# Patient Record
Sex: Female | Born: 1960 | Race: White | Hispanic: No | Marital: Married | Smoking: Never smoker
Health system: Southern US, Community
[De-identification: ages and names within clinical notes are randomized; demographics above are authoritative.]

## PROBLEM LIST (undated history)

## (undated) DIAGNOSIS — M069 Rheumatoid arthritis, unspecified: Secondary | ICD-10-CM

## (undated) DIAGNOSIS — M1990 Unspecified osteoarthritis, unspecified site: Secondary | ICD-10-CM

## (undated) DIAGNOSIS — M2550 Pain in unspecified joint: Secondary | ICD-10-CM

## (undated) DIAGNOSIS — F419 Anxiety disorder, unspecified: Secondary | ICD-10-CM

## (undated) DIAGNOSIS — F329 Major depressive disorder, single episode, unspecified: Secondary | ICD-10-CM

## (undated) DIAGNOSIS — M79643 Pain in unspecified hand: Secondary | ICD-10-CM

## (undated) DIAGNOSIS — T7840XA Allergy, unspecified, initial encounter: Secondary | ICD-10-CM

## (undated) DIAGNOSIS — I10 Essential (primary) hypertension: Secondary | ICD-10-CM

## (undated) DIAGNOSIS — F32A Depression, unspecified: Secondary | ICD-10-CM

## (undated) HISTORY — DX: Unspecified osteoarthritis, unspecified site: M19.90

## (undated) HISTORY — DX: Allergy, unspecified, initial encounter: T78.40XA

## (undated) HISTORY — DX: Essential (primary) hypertension: I10

## (undated) HISTORY — DX: Anxiety disorder, unspecified: F41.9

## (undated) HISTORY — DX: Pain in unspecified joint: M25.50

## (undated) HISTORY — DX: Pain in unspecified hand: M79.643

## (undated) HISTORY — DX: Depression, unspecified: F32.A

## (undated) HISTORY — DX: Major depressive disorder, single episode, unspecified: F32.9

## (undated) HISTORY — DX: Rheumatoid arthritis, unspecified: M06.9

---

## 2013-03-01 ENCOUNTER — Other Ambulatory Visit (HOSPITAL_COMMUNITY)
Admission: RE | Admit: 2013-03-01 | Discharge: 2013-03-01 | Disposition: A | Discharge status: Home / Self Care | Payer: 59 | Source: Ambulatory Visit | Attending: Family Medicine | Admitting: Family Medicine

## 2013-03-01 DIAGNOSIS — Z124 Encounter for screening for malignant neoplasm of cervix: Secondary | ICD-10-CM | POA: Insufficient documentation

## 2014-08-01 ENCOUNTER — Ambulatory Visit (INDEPENDENT_AMBULATORY_CARE_PROVIDER_SITE_OTHER): Payer: 59 | Admitting: Podiatry

## 2014-08-01 ENCOUNTER — Encounter: Payer: Self-pay | Admitting: Podiatry

## 2014-08-01 VITALS — BP 132/76 | HR 69 | Resp 18

## 2014-08-01 DIAGNOSIS — L03039 Cellulitis of unspecified toe: Secondary | ICD-10-CM

## 2014-08-01 MED ORDER — CEPHALEXIN 500 MG PO CAPS: 500.0000 mg | ORAL_CAPSULE | Freq: Three times a day (TID) | ORAL | Status: DC

## 2014-08-01 NOTE — Progress Notes (Signed)
° °  Subjective:    Patient ID: Dana Johnston, female    DOB: 1961/11/18, 52 y.o.   MRN: [id number]  HPI 53 year old female presents the office today for complaints of right lateral border of the hallux ingrown toenail. She states it has been like this for over the past week. This is a small amount of purulence and redness from around the nail border as well as pain.. States that the nail previously fell off after an injury last year. States that as the nail grew back she noticed it was ingrowing. Denies any systemic complaints as fevers, chills, nausea, vomiting.  Review of Systems  Musculoskeletal:       JOINT PAIN  Allergic/Immunologic: Positive for food allergies.  All other systems reviewed and are negative.      Objective:   Physical Exam AAO x3, NAD DP/PT pulses palpable 2/4 b/l. CRT <3sec Protective sensation intact the Semmes Weinstein monofilament, vibratory sensation intact, Achilles tendon reflex intact.  Right lateral border of the hallux with localized erythema around the nail border. No ascending cellulitis. Mild amount of purulent drainage from around the proximal lateral nail border. Lateral nail border is ingrown. Remaining nails without any evidence of ingrowing, erythema, drainage. No open lesions MMT 5/5, ROM WNL No leg pain, warmth, swelling.     Assessment & Plan:  53 year old female right lateral hallux border paronychia. -Surgical versus conservative treatment were discussed with the patient in detail including alternatives, risks, complications. -At this time the patient elects to proceed with partial nail avulsion. Risks and complications discussed the patient agrees to procedure. -Under sterile skin preparation a total of 2.5 cc of a one-to-one mixture of 0.5% Marcaine plain and 2% lidocaine plain was infiltrated in a hallux block fashion on the right foot. The skin was then prepped in a sterile fashion. Next the lateral border of the right hallux nail sharply  excised. Of note there is a large portion of ingrowing nail on the lateral border. Care was taken to ensure the entire nail border was adequately removed. Once removed the area was flushed, Silvadene was applied followed by a sterile dressing. A tourniquet was used for the procedure and after the dressing was applied it was removed and there is noted to be an immediate capillary refill time noted to the digit. Patient tolerated the procedure well without complications. -Post procedure instructions given to the patient and she verbally understood. -Prescribed Keflex. -Monitor for any signs or symptoms of infection and directed to call the office immediately if any are to occur or go directly to the emergency room. -Call with any questions concerns or change in symptoms.

## 2014-08-01 NOTE — Patient Instructions (Addendum)
Betadine Soak Instructions  Purchase an 8 oz. bottle of BETADINE solution (Povidone)  THE DAY AFTER THE PROCEDURE  Place 1 tablespoon of betadine solution in a quart of warm tap water.  Submerge your foot or feet with outer bandage intact for the initial soak; this will allow the bandage to become moist and wet for easy lift off.  Once you remove your bandage, continue to soak in the solution for 20 minutes.  This soak should be done twice a day.  Next, remove your foot or feet from solution, blot dry the affected area and cover.  You may use a band aid large enough to cover the area or use gauze and tape.  Apply other medications to the area as directed by the doctor such as cortisporin otic solution (ear drops) or neosporin.  IF YOUR SKIN BECOMES IRRITATED WHILE USING THESE INSTRUCTIONS, IT IS OKAY TO SWITCH TO EPSOM SALTS AND WATER OR WHITE VINEGAR AND WATER.  Monitor for any signs/symptoms of infection. Call the office immediately if any occur or go directly to the emergency room. Call with any questions/concerns.  

## 2014-08-04 ENCOUNTER — Telehealth: Payer: Self-pay | Admitting: Podiatry

## 2014-08-04 NOTE — Telephone Encounter (Signed)
Called patient to follow up with her after the nail procedure on Friday. States she is doing well. Denies any fevers, chills, nausea, or vomiting. States she has started to leave it uncovered while working. I recommended that she keep the toe covered with antibiotic ointment/band-aid during the day. Also recommended continuing with BID epsom salt soaks. No other complaints at this time. Follow-up as scheduled. Call with any question/concerns in the meantime.

## 2014-08-06 ENCOUNTER — Ambulatory Visit: Payer: 59 | Admitting: Podiatry

## 2014-08-15 ENCOUNTER — Ambulatory Visit: Payer: 59 | Admitting: Podiatry

## 2015-03-19 ENCOUNTER — Other Ambulatory Visit (HOSPITAL_COMMUNITY)
Admission: RE | Admit: 2015-03-19 | Discharge: 2015-03-19 | Disposition: A | Discharge status: Home / Self Care | Payer: 59 | Source: Ambulatory Visit | Attending: Internal Medicine | Admitting: Internal Medicine

## 2015-03-19 DIAGNOSIS — Z01419 Encounter for gynecological examination (general) (routine) without abnormal findings: Secondary | ICD-10-CM | POA: Insufficient documentation

## 2015-03-20 ENCOUNTER — Other Ambulatory Visit: Payer: Self-pay

## 2015-03-20 DIAGNOSIS — Z1231 Encounter for screening mammogram for malignant neoplasm of breast: Secondary | ICD-10-CM

## 2015-04-15 ENCOUNTER — Ambulatory Visit: Payer: Self-pay

## 2015-05-19 ENCOUNTER — Encounter: Payer: Self-pay | Admitting: Internal Medicine

## 2016-01-15 ENCOUNTER — Other Ambulatory Visit: Payer: Self-pay | Admitting: Internal Medicine

## 2016-01-15 DIAGNOSIS — M25562 Pain in left knee: Secondary | ICD-10-CM

## 2016-02-03 ENCOUNTER — Ambulatory Visit
Admission: RE | Admit: 2016-02-03 | Discharge: 2016-02-03 | Disposition: A | Discharge status: Home / Self Care | Payer: BLUE CROSS/BLUE SHIELD | Source: Ambulatory Visit | Attending: Internal Medicine | Admitting: Internal Medicine

## 2016-02-03 DIAGNOSIS — M25562 Pain in left knee: Secondary | ICD-10-CM

## 2016-03-21 HISTORY — PX: MEDIAL PARTIAL KNEE REPLACEMENT: SHX5965

## 2016-10-11 ENCOUNTER — Telehealth: Payer: Self-pay | Admitting: *Deleted

## 2016-10-11 NOTE — Telephone Encounter (Signed)
I would wait until she has a more stable platelet count

## 2016-10-11 NOTE — Telephone Encounter (Deleted)
Pt wants to know if it's ok for her to get the flu vaccine. Thanks

## 2016-10-11 NOTE — Telephone Encounter (Deleted)
Pt called and informed to wait getting flu vaccine until platelet count more stable per Dr Cyndie Chime. Voiced understanding.

## 2016-12-09 ENCOUNTER — Other Ambulatory Visit: Payer: Self-pay | Admitting: Oncology

## 2016-12-09 ENCOUNTER — Telehealth: Payer: Self-pay | Admitting: *Deleted

## 2016-12-09 DIAGNOSIS — D693 Immune thrombocytopenic purpura: Secondary | ICD-10-CM

## 2016-12-09 DIAGNOSIS — I10 Essential (primary) hypertension: Secondary | ICD-10-CM

## 2016-12-09 DIAGNOSIS — E876 Hypokalemia: Secondary | ICD-10-CM

## 2016-12-09 NOTE — Telephone Encounter (Deleted)
Dana Johnston called / stated she will be here in [place] on Wed; wants her labs done here at [facility]. Need orders. She will be here around 1100 AM. Thanks

## 2016-12-09 NOTE — Telephone Encounter (Signed)
OK I will put them in

## 2016-12-13 NOTE — Progress Notes (Signed)
Ordered on wrong pt.

## 2016-12-13 NOTE — Telephone Encounter (Signed)
Documented in error.

## 2016-12-13 NOTE — Telephone Encounter (Signed)
Charted in error.

## 2016-12-14 ENCOUNTER — Other Ambulatory Visit: Payer: BLUE CROSS/BLUE SHIELD

## 2017-03-29 NOTE — Addendum Note (Signed)
Addended by: Remus Blake on: 03/29/2017 02:18 PM   Modules accepted: Orders

## 2017-05-01 ENCOUNTER — Encounter (HOSPITAL_COMMUNITY): Payer: Self-pay

## 2017-05-01 ENCOUNTER — Inpatient Hospital Stay (HOSPITAL_COMMUNITY)
Admission: EM | Admit: 2017-05-01 | Discharge: 2017-05-03 | DRG: 918 | Disposition: A | Discharge status: Other Acute Inpatient Hospital | Payer: BLUE CROSS/BLUE SHIELD | Attending: Internal Medicine | Admitting: Internal Medicine

## 2017-05-01 DIAGNOSIS — T450X2A Poisoning by antiallergic and antiemetic drugs, intentional self-harm, initial encounter: Secondary | ICD-10-CM | POA: Diagnosis not present

## 2017-05-01 DIAGNOSIS — T50902A Poisoning by unspecified drugs, medicaments and biological substances, intentional self-harm, initial encounter: Secondary | ICD-10-CM | POA: Diagnosis not present

## 2017-05-01 DIAGNOSIS — F32A Depression, unspecified: Secondary | ICD-10-CM

## 2017-05-01 DIAGNOSIS — Z79899 Other long term (current) drug therapy: Secondary | ICD-10-CM

## 2017-05-01 DIAGNOSIS — T50901A Poisoning by unspecified drugs, medicaments and biological substances, accidental (unintentional), initial encounter: Secondary | ICD-10-CM | POA: Diagnosis present

## 2017-05-01 DIAGNOSIS — R443 Hallucinations, unspecified: Secondary | ICD-10-CM | POA: Diagnosis present

## 2017-05-01 DIAGNOSIS — R400 Somnolence: Secondary | ICD-10-CM | POA: Diagnosis present

## 2017-05-01 DIAGNOSIS — M1990 Unspecified osteoarthritis, unspecified site: Secondary | ICD-10-CM | POA: Diagnosis present

## 2017-05-01 DIAGNOSIS — R4182 Altered mental status, unspecified: Secondary | ICD-10-CM | POA: Diagnosis present

## 2017-05-01 DIAGNOSIS — E876 Hypokalemia: Secondary | ICD-10-CM | POA: Diagnosis present

## 2017-05-01 DIAGNOSIS — F329 Major depressive disorder, single episode, unspecified: Secondary | ICD-10-CM | POA: Diagnosis present

## 2017-05-01 HISTORY — DX: Poisoning by unspecified drugs, medicaments and biological substances, accidental (unintentional), initial encounter: T50.901A

## 2017-05-01 LAB — COMPREHENSIVE METABOLIC PANEL
ALBUMIN: 4.3 g/dL (ref 3.5–5.0)
ALT: 21 U/L (ref 14–54)
ANION GAP: 10 (ref 5–15)
AST: 23 U/L (ref 15–41)
Alkaline Phosphatase: 91 U/L (ref 38–126)
BUN: 13 mg/dL (ref 6–20)
CHLORIDE: 107 mmol/L (ref 101–111)
CO2: 23 mmol/L (ref 22–32)
Calcium: 9.1 mg/dL (ref 8.9–10.3)
Creatinine, Ser: 0.93 mg/dL (ref 0.44–1.00)
GFR calc Af Amer: >60 mL/min (ref >60)
GFR calc non Af Amer: >60 mL/min (ref >60)
GLUCOSE: 98 mg/dL (ref 65–99)
Potassium: 3.3 mmol/L — ABNORMAL LOW (ref 3.5–5.1)
SODIUM: 140 mmol/L (ref 135–145)
TOTAL PROTEIN: 7.2 g/dL (ref 6.5–8.1)
Total Bilirubin: 0.7 mg/dL (ref 0.3–1.2)

## 2017-05-01 LAB — URINALYSIS, ROUTINE W REFLEX MICROSCOPIC
BILIRUBIN URINE: NEGATIVE
Glucose, UA: NEGATIVE mg/dL
HGB URINE DIPSTICK: NEGATIVE
Ketones, ur: NEGATIVE mg/dL
Nitrite: NEGATIVE
PH: 6 (ref 5.0–8.0)
Protein, ur: NEGATIVE mg/dL
SPECIFIC GRAVITY, URINE: 1.016 (ref 1.005–1.030)

## 2017-05-01 LAB — CBC WITH DIFFERENTIAL/PLATELET
BASOS ABS: 0 10*3/uL (ref 0.0–0.1)
Basophils Relative: 0 %
Eosinophils Absolute: 0.3 10*3/uL (ref 0.0–0.7)
Eosinophils Relative: 3 %
HEMATOCRIT: 44.2 % (ref 36.0–46.0)
Hemoglobin: 14.6 g/dL (ref 12.0–15.0)
LYMPHS ABS: 1.7 10*3/uL (ref 0.7–4.0)
LYMPHS PCT: 21 %
MCH: 29.4 pg (ref 26.0–34.0)
MCHC: 33 g/dL (ref 30.0–36.0)
MCV: 88.9 fL (ref 78.0–100.0)
MONO ABS: 0.7 10*3/uL (ref 0.1–1.0)
MONOS PCT: 8 %
NEUTROS ABS: 5.5 10*3/uL (ref 1.7–7.7)
Neutrophils Relative %: 68 %
Platelets: 283 10*3/uL (ref 150–400)
RBC: 4.97 MIL/uL (ref 3.87–5.11)
RDW: 12.7 % (ref 11.5–15.5)
WBC: 8.1 10*3/uL (ref 4.0–10.5)

## 2017-05-01 LAB — RAPID URINE DRUG SCREEN, HOSP PERFORMED
Amphetamines: NOT DETECTED
BARBITURATES: NOT DETECTED
Benzodiazepines: NOT DETECTED
Cocaine: NOT DETECTED
Opiates: NOT DETECTED
TETRAHYDROCANNABINOL: NOT DETECTED

## 2017-05-01 LAB — ETHANOL

## 2017-05-01 MED ORDER — SODIUM CHLORIDE 0.9 % IV BOLUS (SEPSIS)
500.0000 mL | Freq: Once | INTRAVENOUS | Status: AC
  Administered 2017-05-01: 500 mL via INTRAVENOUS

## 2017-05-01 MED ORDER — LORAZEPAM 2 MG/ML IJ SOLN
1.0000 mg | Freq: Once | INTRAMUSCULAR | Status: AC
  Administered 2017-05-01: 1 mg via INTRAVENOUS
  Filled 2017-05-01: qty 1

## 2017-05-01 MED ORDER — CHARCOAL ACTIVATED PO LIQD
50.0000 g | Freq: Once | ORAL | Status: AC
  Administered 2017-05-01: 50 g via ORAL
  Filled 2017-05-01: qty 240

## 2017-05-01 NOTE — ED Notes (Addendum)
Poison control called by this nurse. Poison control recommendations are as follows:  1) While patient is awake and alert, administer PO Charcoal. 2) Should patient experience tachycardia and hypertension, these could be signs of early toxicity. Possible side effects of toxicity include seizures, confusion, lethargy, and agitation. Administer benzodiazepine of MD's choice to mitigate these side effects. 3) Should patient have a seizure despite benzodiazepine administration, administer phenobarbital.  MD Effie Shy notified face to face of poison control recommendations. See new orders.

## 2017-05-01 NOTE — ED Triage Notes (Signed)
Patient BIB EMS from home, c/o SI, benedryl PO overdose. Patient reports taking approx x50 pills of 25mg  Benedryl, but states " I threw up a few after taking them." Patient alert in triage. VSS- patient hypertensive and tachycardic for EMS.

## 2017-05-01 NOTE — ED Provider Notes (Addendum)
[facility]-EMERGENCY DEPT Provider Note   CSN: [id number] Arrival date & time: 05/01/17  1739     History   Chief Complaint Chief Complaint  Patient presents with  . Drug Overdose  . Suicidal    HPI Dana Johnston is a 56 y.o. female.  She presents for evaluation of an intentional overdose of Benadryl, with intentional self-harm.  Patient reports that she was distressed because her wife's son, is disruptive and has a defiant behavioral disorder.  This is causing chaos in the home, which makes it very difficult to stay there.  The patient has recently started seeing a psychiatrist but is not on psychiatric medications, and is not seeing a therapist.  She denies prior suicide attempt.  She still feels suicidal.  She denies any recent illnesses or other ingestions.  There are no other known modifying factors.  HPI  Past Medical History:  Diagnosis Date  . Arthritis   . Hand pain   . Polyarthralgia     There are no active problems to display for this patient.   History reviewed. No pertinent surgical history.  OB History    No data available       Home Medications    Prior to Admission medications   Medication Sig Start Date End Date Taking? Authorizing Provider  meloxicam (MOBIC) 15 MG tablet Take 15 mg by mouth daily.   Yes [provider]  methotrexate (RHEUMATREX) 10 MG tablet Take 60 mg by mouth every 30 (thirty) days. Caution: Chemotherapy. Protect from light.   Yes [provider]  zolmitriptan (ZOMIG) 5 MG tablet Take 5 mg by mouth daily as needed for migraine.  07/10/14  Yes [provider]  cephALEXin (KEFLEX) 500 MG capsule Take 1 capsule (500 mg total) by mouth 3 (three) times daily. Patient not taking: Reported on 05/01/2017 08/01/14   Vivi Barrack, DPM    Family History History reviewed. No pertinent family history.  Social History Social History  Substance Use Topics  . Smoking status: Never Smoker  . Smokeless tobacco: Never  Used  . Alcohol use Yes     Comment: Occasional margarita     Allergies   Other and Sulfa antibiotics   Review of Systems Review of Systems  All other systems reviewed and are negative.    Physical Exam Updated Vital Signs BP (!) 158/79   Pulse (!) 112   Temp 98.2 F (36.8 C) (Oral)   Resp (!) 25   Ht 5' 5.5" (1.664 m)   Wt 89.4 kg (197 lb)   SpO2 100%   BMI 32.28 kg/m   Physical Exam  Constitutional: She is oriented to person, place, and time. She appears well-developed and well-nourished. She appears distressed (She is uncomfortable).  HENT:  Head: Normocephalic and atraumatic.  Eyes: Conjunctivae and EOM are normal. Pupils are equal, round, and reactive to light.  Neck: Normal range of motion and phonation normal. Neck supple.  Cardiovascular: Normal rate and regular rhythm.   Pulmonary/Chest: Effort normal and breath sounds normal. She exhibits no tenderness.  Abdominal: Soft. She exhibits no distension. There is no tenderness. There is no guarding.  Musculoskeletal: Normal range of motion.  Neurological: She is alert and oriented to person, place, and time. She exhibits normal muscle tone.  Dysarthria is present.  No aphasia or nystagmus.  Skin: Skin is warm and dry.  Psychiatric: She has a normal mood and affect. Her behavior is normal.  Somewhat lethargic. She appears depressed.  Nursing note  and vitals reviewed.    ED Treatments / Results  Labs (all labs ordered are listed, but only abnormal results are displayed) Labs Reviewed  COMPREHENSIVE METABOLIC PANEL - Abnormal; Notable for the following:       Result Value   Potassium 3.3 (*)    All other components within normal limits  URINALYSIS, ROUTINE W REFLEX MICROSCOPIC - Abnormal; Notable for the following:    APPearance HAZY (*)    Leukocytes, UA TRACE (*)    Bacteria, UA RARE (*)    Squamous Epithelial / LPF 6-30 (*)    All other components within normal limits  CBC WITH DIFFERENTIAL/PLATELET    ETHANOL  RAPID URINE DRUG SCREEN, HOSP PERFORMED    EKG  EKG Interpretation  Date/Time:  Monday May 01 2017 18:02:14 EDT Ventricular Rate:  112 PR Interval:    QRS Duration: 80 QT Interval:  314 QTC Calculation: 429 R Axis:   6 Text Interpretation:  Sinus tachycardia Left atrial enlargement Anterior infarct, old Minimal ST depression No old tracing to compare Confirmed by Mancel Bale [phone]) on 05/01/2017 7:36:48 PM       Radiology No results found.  Procedures Procedures (including critical care time)  Medications Ordered in ED Medications  sodium chloride 0.9 % bolus 500 mL (0 mLs Intravenous Stopped 05/01/17 1950)  charcoal activated (NO SORBITOL) (ACTIDOSE-AQUA) suspension 50 g (50 g Oral Given 05/01/17 1905)  LORazepam (ATIVAN) injection 1 mg (1 mg Intravenous Given 05/01/17 1908)  LORazepam (ATIVAN) injection 1 mg (1 mg Intravenous Given 05/01/17 2009)     Initial Impression / Assessment and Plan / ED Course  I have reviewed the triage vital signs and the nursing notes.  Pertinent labs & imaging results that were available during my care of the patient were reviewed by me and considered in my medical decision making (see chart for details).      Patient Vitals for the past 24 hrs:  BP Temp Temp src Pulse Resp SpO2 Height Weight  05/01/17 2130 136/83 - - (!) 107 (!) 22 94 % - -  05/01/17 2104 136/79 98.2 F (36.8 C) Oral (!) 108 (!) 25 96 % - -  05/01/17 2059 - - - (!) 110 (!) 24 96 % - -  05/01/17 2030 125/87 - - (!) 112 19 100 % - -  05/01/17 2023 (!) 150/80 - - (!) 113 14 97 % - -  05/01/17 2000 (!) 146/70 - - (!) 113 (!) 22 99 % - -  05/01/17 1944 (!) 158/79 - - (!) 112 (!) 25 100 % - -  05/01/17 1930 (!) 160/84 - - (!) 114 (!) 22 100 % - -  05/01/17 1900 (!) 148/70 - - (!) 108 (!) 21 100 % - -  05/01/17 1830 (!) 166/76 - - (!) 113 (!) 21 100 % - -  05/01/17 1800 (!) 159/79 - - (!) 114 (!) 25 98 % - -  05/01/17 1751 (!) 165/87 98.2 F (36.8 C) Oral  (!) 116 19 99 % - -  05/01/17 1748 - - - - - - 5' 5.5" (1.664 m) 89.4 kg (197 lb)  05/01/17 1744 - - - - - 97 % - -    10:48 PM Reevaluation with update and discussion. After initial assessment and treatment, an updated evaluation reveals patient continues to have progressive symptoms of confusion, and according to her wife, is "hallucinating."  The patient continues to be alert and responsive, is not oversedated.  Findings  have been discussed with poison control who advises overnight observation, until her mental status has cleared.  Findings discussed with patient and family members, all questions answered. Kaylaann Mountz L   11:03 PM-Consult complete with specialist. Patient case explained and discussed.  She agrees to admit patient for further evaluation and treatment. Call ended at 23: 23   CRITICAL CARE Performed by: Mancel Bale L Total critical care time: 35 minutes Critical care time was exclusive of separately billable procedures and treating other patients. Critical care was necessary to treat or prevent imminent or life-threatening deterioration. Critical care was time spent personally by me on the following activities: development of treatment plan with patient and/or surrogate as well as nursing, discussions with consultants, evaluation of patient's response to treatment, examination of patient, obtaining history from patient or surrogate, ordering and performing treatments and interventions, ordering and review of laboratory studies, ordering and review of radiographic studies, pulse oximetry and re-evaluation of patient's condition.   Final Clinical Impressions(s) / ED Diagnoses   Final diagnoses:  Intentional drug overdose, initial encounter (HCC)  Depression, unspecified depression type   Intentional drug overdose, Benadryl, with confusion, and hallucinations as a side effect.  Patient had progression of symptoms in the emergency department and will require hospitalization  for observation.  Nursing Notes Reviewed/ Care Coordinated Applicable Imaging Reviewed Interpretation of Laboratory Data incorporated into ED treatment   Plan: Admit      New Prescriptions New Prescriptions   No medications on file     Mancel Bale, MD 05/01/17 2325    Mancel Bale, MD 05/01/17 2325

## 2017-05-01 NOTE — ED Notes (Signed)
Bed: RESB Expected date:  Expected time:  Means of arrival:  Comments: EMS-OD 

## 2017-05-01 NOTE — ED Notes (Signed)
Arbutus Leas (spouse): [phone].

## 2017-05-01 NOTE — ED Notes (Addendum)
Pt has in belonging bag:  Red shirt, light green shorts, black shoes, black socks, tan bra, and brown wallet (with cards).  Pt's belonging is in the cabinets behind the nurses station 16-18

## 2017-05-01 NOTE — H&P (Signed)
History and Physical    Dana Johnston ZDG:[id number] DOB: 27-May-1961 DOA: 05/01/2017  PCP: Maurice Small, MD  Patient coming from: Home - lives with her spouse  Chief Complaint: overdose  HPI: Dana Johnston is a 56 y.o. female with medical history significant of arthritis presenting after an overdose.  While she is oriented x 3, she is quite somnolent and slurs most speech making her generally not understandable.  She is unaccompanied.  She did report that she had never tried to kill herself in the past but was unable to provide additional information.  HPI per EDP: She presents for evaluation of an intentional overdose of Benadryl, with intentional self-harm. Patient reports that she was distressed because her wife's son, is disruptive and has a defiant behavioral disorder. This is causing chaos in the home, which makes it very difficult to stay there. The patient has recently started seeing a psychiatrist but is not on psychiatric medications, and is not seeing a therapist. She denies prior suicide attempt. She still feels suicidal. She denies any recent illnesses or other ingestions. There are no other known modifying factors.   ED Course: Intentional overdose with Benadryl.  Confusion, hallucinations.  Poison Control recommends observation.  Review of Systems: Unable to perform  PMH, PSH, FH, and SH reviewed in Epic but unable to confirm with patient.  Past Medical History:  Diagnosis Date  . Arthritis   . Hand pain   . Polyarthralgia     History reviewed. No pertinent surgical history.  Social History   Social History  . Marital status: Married    Spouse name: N/A  . Number of children: N/A  . Years of education: N/A   Occupational History  . Not on file.   Social History Main Topics  . Smoking status: Never Smoker  . Smokeless tobacco: Never Used  . Alcohol use Yes     Comment: Occasional margarita  . Drug use: No  . Sexual activity: Not on file   Other Topics  Concern  . Not on file   Social History Narrative  . No narrative on file    Allergies  Allergen Reactions  . Other Swelling    nuts  . Sulfa Antibiotics Swelling    History reviewed. No pertinent family history.  Prior to Admission medications   Medication Sig Start Date End Date Taking? Authorizing Provider  meloxicam (MOBIC) 15 MG tablet Take 15 mg by mouth daily.   Yes [provider]  methotrexate (RHEUMATREX) 10 MG tablet Take 60 mg by mouth every 30 (thirty) days. Caution: Chemotherapy. Protect from light.   Yes [provider]  zolmitriptan (ZOMIG) 5 MG tablet Take 5 mg by mouth daily as needed for migraine.  07/10/14  Yes [provider]  cephALEXin (KEFLEX) 500 MG capsule Take 1 capsule (500 mg total) by mouth 3 (three) times daily. Patient not taking: Reported on 05/01/2017 08/01/14   Vivi Barrack, [place]    Physical Exam: Vitals:   05/01/17 2230 05/01/17 2300 05/01/17 2330 05/01/17 2334  BP: 127/70 126/66 (!) 141/67   Pulse: 96 95 (!) 114   Resp: (!) 22 20 (!) 22   Temp:    98.2 F (36.8 C)  TempSrc:    Oral  SpO2: 97% 98% 97%   Weight:      Height:         General:  Appears very somnolent, slow to respond if she does at all Eyes:  PERRL, EOMI, normal lids, iris ENT:  grossly normal hearing, lips & tongue, mmm Neck:  no LAD, masses or thyromegaly Cardiovascular:  RRR, no m/r/g. No LE edema.  Respiratory:  CTA bilaterally, no w/r/r. Normal respiratory effort. Abdomen:  soft, ntnd, NABS Skin:  no rash or induration seen on limited exam Musculoskeletal:  grossly normal tone BUE/BLE, good ROM, no bony abnormality Psychiatric: excessively somnolent, speech slurred and nonsensical,  AOx3, appears to be hallucinating (swatting at things that aren't present) Neurologic:  Unable to perform  Labs on Admission: I have personally reviewed following labs and imaging studies  CBC:  Recent Labs Lab 05/01/17 1814  WBC 8.1  NEUTROABS  5.5  HGB 14.6  HCT 44.2  MCV 88.9  PLT 283   Basic Metabolic Panel:  Recent Labs Lab 05/01/17 1814  NA 140  K 3.3*  CL 107  CO2 23  GLUCOSE 98  BUN 13  CREATININE 0.93  CALCIUM 9.1   GFR: Estimated Creatinine Clearance: 77.2 mL/min (by C-G formula based on SCr of 0.93 mg/dL). Liver Function Tests:  Recent Labs Lab 05/01/17 1814  AST 23  ALT 21  ALKPHOS 91  BILITOT 0.7  PROT 7.2  ALBUMIN 4.3   No results for input(s): LIPASE, AMYLASE in the last 168 hours. No results for input(s): AMMONIA in the last 168 hours. Coagulation Profile: No results for input(s): INR, PROTIME in the last 168 hours. Cardiac Enzymes: No results for input(s): CKTOTAL, CKMB, CKMBINDEX, TROPONINI in the last 168 hours. BNP (last 3 results) No results for input(s): PROBNP in the last 8760 hours. HbA1C: No results for input(s): HGBA1C in the last 72 hours. CBG: No results for input(s): GLUCAP in the last 168 hours. Lipid Profile: No results for input(s): CHOL, HDL, LDLCALC, TRIG, CHOLHDL, LDLDIRECT in the last 72 hours. Thyroid Function Tests: No results for input(s): TSH, T4TOTAL, FREET4, T3FREE, THYROIDAB in the last 72 hours. Anemia Panel: No results for input(s): VITAMINB12, FOLATE, FERRITIN, TIBC, IRON, RETICCTPCT in the last 72 hours. Urine analysis:    Component Value Date/Time   COLORURINE YELLOW 05/01/2017 1825   APPEARANCEUR HAZY (A) 05/01/2017 1825   LABSPEC 1.016 05/01/2017 1825   PHURINE 6.0 05/01/2017 1825   GLUCOSEU NEGATIVE 05/01/2017 1825   HGBUR NEGATIVE 05/01/2017 1825   BILIRUBINUR NEGATIVE 05/01/2017 1825   KETONESUR NEGATIVE 05/01/2017 1825   PROTEINUR NEGATIVE 05/01/2017 1825   NITRITE NEGATIVE 05/01/2017 1825   LEUKOCYTESUR TRACE (A) 05/01/2017 1825    Creatinine Clearance: Estimated Creatinine Clearance: 77.2 mL/min (by C-G formula based on SCr of 0.93 mg/dL).  Sepsis Labs: @LABRCNTIP (procalcitonin:4,lacticidven:4) )No results found for this or any  previous visit (from the past 240 hour(s)).   Radiological Exams on Admission: No results found.  EKG: Independently reviewed.  Sinus tachycardia with rate 112; nonspecific ST changes that appear to be rate-related  Assessment/Plan Principal Problem:   Overdose   -Patient with a reported intentional overdose with Benadryl -Due to its anticholinergic side effects, this is somewhat concerning and the patient needs to be monitored in SDU -Her excessive drowsiness is likely directly related to the overdose, as are the apparent hallucinations -Will order I/O cath in case patient develops urinary retention -Patient without known prior overdoses and denies h/o attempts in the past -Tylenol pending -Salicylate pending -UDS negative -UA negative -Poison Control was contacted by the ER and they suggest overnight observation prior to psych consult -Will need sitter -Suicide precautions -Will need psychiatry consult once medically cleared, likely early tomorrow -Hold all medications for now based on AMS  DVT prophylaxis:  Lovenox  Code Status: Full  Family Communication: None present Disposition Plan: To be determined Consults called: Will need psychiatry  Admission status: It is my clinical opinion that referral for OBSERVATION is reasonable and necessary in this patient based on the above information provided. The aforementioned taken together are felt to place the patient at high risk for further clinical deterioration. However it is anticipated that the patient may be medically stable for discharge from the hospital within 24 to 48 hours.   Total critical care time: 35 minutes Critical care time was exclusive of separately billable procedures and treating other patients. Critical care was necessary to treat or prevent imminent or life-threatening deterioration. Critical care was time spent personally by me on the following activities: development of treatment plan with patient and/or  surrogate as well as nursing, discussions with consultants, evaluation of patient's response to treatment, examination of patient, obtaining history from patient or surrogate, ordering and performing treatments and interventions, ordering and review of laboratory studies, ordering and review of radiographic studies, pulse oximetry and re-evaluation of patient's condition.   Jonah Blue MD [facility]  If 7PM-7AM, please contact night-coverage [email or web address] Password [facility]  05/02/2017, 12:05 AM

## 2017-05-02 DIAGNOSIS — T1491XA Suicide attempt, initial encounter: Secondary | ICD-10-CM | POA: Diagnosis not present

## 2017-05-02 DIAGNOSIS — T450X2A Poisoning by antiallergic and antiemetic drugs, intentional self-harm, initial encounter: Principal | ICD-10-CM

## 2017-05-02 DIAGNOSIS — T50902A Poisoning by unspecified drugs, medicaments and biological substances, intentional self-harm, initial encounter: Secondary | ICD-10-CM | POA: Diagnosis not present

## 2017-05-02 DIAGNOSIS — F332 Major depressive disorder, recurrent severe without psychotic features: Secondary | ICD-10-CM

## 2017-05-02 DIAGNOSIS — R400 Somnolence: Secondary | ICD-10-CM | POA: Diagnosis present

## 2017-05-02 DIAGNOSIS — F329 Major depressive disorder, single episode, unspecified: Secondary | ICD-10-CM

## 2017-05-02 DIAGNOSIS — R4182 Altered mental status, unspecified: Secondary | ICD-10-CM | POA: Diagnosis present

## 2017-05-02 DIAGNOSIS — Z79899 Other long term (current) drug therapy: Secondary | ICD-10-CM | POA: Diagnosis not present

## 2017-05-02 DIAGNOSIS — M1990 Unspecified osteoarthritis, unspecified site: Secondary | ICD-10-CM | POA: Diagnosis present

## 2017-05-02 DIAGNOSIS — R443 Hallucinations, unspecified: Secondary | ICD-10-CM | POA: Diagnosis present

## 2017-05-02 DIAGNOSIS — E876 Hypokalemia: Secondary | ICD-10-CM | POA: Diagnosis not present

## 2017-05-02 LAB — CBC
HEMATOCRIT: 43.5 % (ref 36.0–46.0)
HEMOGLOBIN: 14.4 g/dL (ref 12.0–15.0)
MCH: 29.9 pg (ref 26.0–34.0)
MCHC: 33.1 g/dL (ref 30.0–36.0)
MCV: 90.2 fL (ref 78.0–100.0)
PLATELETS: 269 10*3/uL (ref 150–400)
RBC: 4.82 MIL/uL (ref 3.87–5.11)
RDW: 12.9 % (ref 11.5–15.5)
WBC: 6.3 10*3/uL (ref 4.0–10.5)

## 2017-05-02 LAB — BASIC METABOLIC PANEL
ANION GAP: 8 (ref 5–15)
BUN: 10 mg/dL (ref 6–20)
CHLORIDE: 107 mmol/L (ref 101–111)
CO2: 25 mmol/L (ref 22–32)
Calcium: 8.8 mg/dL — ABNORMAL LOW (ref 8.9–10.3)
Creatinine, Ser: 0.86 mg/dL (ref 0.44–1.00)
GFR calc Af Amer: >60 mL/min (ref >60)
GFR calc non Af Amer: >60 mL/min (ref >60)
Glucose, Bld: 94 mg/dL (ref 65–99)
POTASSIUM: 3.8 mmol/L (ref 3.5–5.1)
Sodium: 140 mmol/L (ref 135–145)

## 2017-05-02 LAB — SALICYLATE LEVEL: Salicylate Lvl: <7 mg/dL (ref 2.8–30.0)

## 2017-05-02 LAB — HIV ANTIBODY (ROUTINE TESTING W REFLEX): HIV SCREEN 4TH GENERATION: NONREACTIVE

## 2017-05-02 LAB — MRSA PCR SCREENING: MRSA BY PCR: NEGATIVE

## 2017-05-02 LAB — ACETAMINOPHEN LEVEL: Acetaminophen (Tylenol), Serum: <10 ug/mL — ABNORMAL LOW (ref 10–30)

## 2017-05-02 MED ORDER — SODIUM CHLORIDE 0.9% FLUSH: 3.0000 mL | Freq: Two times a day (BID) | INTRAVENOUS | Status: DC

## 2017-05-02 MED ORDER — POTASSIUM CHLORIDE 20 MEQ/15ML (10%) PO SOLN
20.0000 meq | Freq: Once | ORAL | Status: AC
  Administered 2017-05-02: 20 meq via ORAL
  Filled 2017-05-02: qty 15

## 2017-05-02 MED ORDER — POTASSIUM CHLORIDE IN NACL 20-0.9 MEQ/L-% IV SOLN
INTRAVENOUS | Status: DC
  Administered 2017-05-02: 100 mL/h via INTRAVENOUS
  Filled 2017-05-02: qty 1000

## 2017-05-02 MED ORDER — ONDANSETRON HCL 4 MG PO TABS: 4.0000 mg | ORAL_TABLET | Freq: Four times a day (QID) | ORAL | Status: DC | PRN

## 2017-05-02 MED ORDER — ACETAMINOPHEN 650 MG RE SUPP: 650.0000 mg | Freq: Four times a day (QID) | RECTAL | Status: DC | PRN

## 2017-05-02 MED ORDER — LACTATED RINGERS IV SOLN
INTRAVENOUS | Status: DC
  Administered 2017-05-02 – 2017-05-03 (×2): via INTRAVENOUS

## 2017-05-02 MED ORDER — ENOXAPARIN SODIUM 40 MG/0.4ML ~~LOC~~ SOLN
40.0000 mg | SUBCUTANEOUS | Status: DC
  Administered 2017-05-02 – 2017-05-03 (×2): 40 mg via SUBCUTANEOUS
  Filled 2017-05-02 (×2): qty 0.4

## 2017-05-02 MED ORDER — ONDANSETRON HCL 4 MG/2ML IJ SOLN: 4.0000 mg | Freq: Four times a day (QID) | INTRAMUSCULAR | Status: DC | PRN

## 2017-05-02 MED ORDER — ACETAMINOPHEN 325 MG PO TABS: 650.0000 mg | ORAL_TABLET | Freq: Four times a day (QID) | ORAL | Status: DC | PRN

## 2017-05-02 NOTE — ED Notes (Signed)
Patient has one patient belonging bag in cabinets at blue nurses's station.
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
## 2017-05-02 NOTE — ED Notes (Signed)
Pt is asleep, respirations unlabored, easy to arouse. Will continue to monitor.

## 2017-05-02 NOTE — ED Notes (Signed)
Called Pam Charge RN on [address] to start 20 min timer.

## 2017-05-02 NOTE — ED Notes (Signed)
Spoke with CJ, with Poison Control, provided an update on patients condition. No new advisories.

## 2017-05-02 NOTE — Progress Notes (Signed)
   05/02/17 2100  Clinical Encounter Type  Visited With Patient  Visit Type Initial;Psychological support;Spiritual support;Social support  Referral From Nurse  Consult/Referral To Chaplain  Spiritual Encounters  Spiritual Needs Emotional  Stress Factors  Patient Stress Factors Family relationships;Loss of control;Major life changes   Chaplain responded to a page at 8:30pm to provide emotional and spiritual care to Pt. Pt was alone with only sitter at the bedside. Chaplain prayed with Pt and listened to her hopes and concerns. Chaplain also helped Pt name a few coping skills that she could use to help her "get through this difficult time" . Chaplain will continue to check in on this Pt.

## 2017-05-02 NOTE — ED Notes (Signed)
Psychiatrist at bedside

## 2017-05-02 NOTE — ED Notes (Signed)
Called Pam Charge RN on [address] to inform that MD was in room speaking with patient and would bring patient up when he was finished with his assessment.

## 2017-05-02 NOTE — ED Notes (Signed)
Poison Control called stating they are closing out patient's case at this time.  If patient's bowel sounds are hypoactive and/or having urinary retention, then need to call PC back due to patient still having effects of Benadryl.

## 2017-05-02 NOTE — ED Notes (Signed)
Admitting nurse at bedside 

## 2017-05-02 NOTE — Progress Notes (Signed)
PROGRESS NOTE    Dana Johnston  DPO:[id number] DOB: 12/13/1960 DOA: 05/01/2017 PCP: Maurice Small, MD    Brief Narrative:  56 year old female who presented to the hospital with the chief complain of overdose. She does not have any significant medical history, she intentionally took 25 tablets of Benadryl, 25 mg each in an intention to end her life. She was still suicidal by the time of the initial emergency room evaluation. On initial physical examination blood pressure 127/70, heart rate 96, respiratory rate 22, oxygen saturation 97%, temperature 98.2.  She was somnolent but easy to arouse, her speech was slurred, mucous membranes were moist, lungs were clear to auscultation, heart S1-S2 present and rhythmic, her abdomen was soft nontender, no masses, no organomegaly, lower extremity no edema. Sodium 140, potassium 3.3, chloride 107, bicarbonate 23, glucose 98, BUN 13, creatinine 0.93, AST 23, ALT 21, white count 8.1, Hb 14.6, hematocrit 44.2, platelets 283. Acetaminophen less than 10, salicylate less than 7, urinalysis negative for infection, negative proteins, pH 6.0. Urine drug screen negative, EKG normal sinus rhythm, poor R-wave progression, normal axis, normal intervals, left atrial enlargement.   Patient was admitted with the working diagnosis of intentional diphenhydramine overdose, as a suicidal attempt. Patient was placed IV fluids, on one-to-one sitter, suicidal precautions and psychiatry was consulted.     Principal Problem:   Overdose   1. Diphenhydramine overdose. Will continue neuro checks per unit protocol. Will advance diet to regular and will continue hydration with LR at 75 ml per hour, follow renal panel in am. No clinical signs of anticholinergic syndrome.   2. Suicidal attempt. One to one sitter, suicidal precautions, will follow on psychiatry recommendations, patient agreeable to be admitted to inpatient psych if needed. No agitation or confusion.   3. Hypokalemia. Will  continue K repletion with po KCL, follow electrolytes in am, renal function with preserved renal function cr at 0.86 with K at 3,8 and serum bicarbonate at 25.     DVT prophylaxis: enoxaparin  Code Status: Full  Family Communication: I spoke with patient's wife at the bedside and all questions were addressed.  Disposition Plan: to be determined   Consultants:   Psychiatry   Procedures:     Antimicrobials:     Subjective: Patient with mild somnolence, no nausea or vomiting, no chest pain or dyspnea, no confusion or agitation, denies been suicidal.   Objective: Vitals:   05/02/17 1236 05/02/17 1254 05/02/17 1300 05/02/17 1400  BP: (!) 148/57  (!) 146/62 (!) 138/58  Pulse: 87  93 75  Resp: (!) 25  (!) 24 (!) 21  Temp:  98.5 F (36.9 C)    TempSrc:  Oral    SpO2: 98%  96% 96%  Weight:      Height:        Intake/Output Summary (Last 24 hours) at 05/02/17 1430 Last data filed at 05/02/17 1200  Gross per 24 hour  Intake           873.33 ml  Output                0 ml  Net           873.33 ml   Filed Weights   05/01/17 1748  Weight: 89.4 kg (197 lb)    Examination:  General exam: deconditioned E ENT: no pallor or icterus, oral mucosa moist.  Respiratory system: Clear to auscultation. Respiratory effort normal. No wheezing, rales or rhonchi.  Cardiovascular system: S1 & S2 heard, RRR.  No JVD, murmurs, rubs, gallops or clicks. No pedal edema. Gastrointestinal system: Abdomen is nondistended, soft and nontender. No organomegaly or masses felt. Normal bowel sounds heard. Central nervous system: Alert and oriented. No focal neurological deficits. Extremities: Symmetric 5 x 5 power. Skin: No rashes, lesions or ulcers     Data Reviewed: I have personally reviewed following labs and imaging studies  CBC:  Recent Labs Lab 05/01/17 1814 05/02/17 0809  WBC 8.1 6.3  NEUTROABS 5.5  --   HGB 14.6 14.4  HCT 44.2 43.5  MCV 88.9 90.2  PLT 283 269   Basic  Metabolic Panel:  Recent Labs Lab 05/01/17 1814 05/02/17 0809  NA 140 140  K 3.3* 3.8  CL 107 107  CO2 23 25  GLUCOSE 98 94  BUN 13 10  CREATININE 0.93 0.86  CALCIUM 9.1 8.8*   GFR: Estimated Creatinine Clearance: 83.5 mL/min (by C-G formula based on SCr of 0.86 mg/dL). Liver Function Tests:  Recent Labs Lab 05/01/17 1814  AST 23  ALT 21  ALKPHOS 91  BILITOT 0.7  PROT 7.2  ALBUMIN 4.3   No results for input(s): LIPASE, AMYLASE in the last 168 hours. No results for input(s): AMMONIA in the last 168 hours. Coagulation Profile: No results for input(s): INR, PROTIME in the last 168 hours. Cardiac Enzymes: No results for input(s): CKTOTAL, CKMB, CKMBINDEX, TROPONINI in the last 168 hours. BNP (last 3 results) No results for input(s): PROBNP in the last 8760 hours. HbA1C: No results for input(s): HGBA1C in the last 72 hours. CBG: No results for input(s): GLUCAP in the last 168 hours. Lipid Profile: No results for input(s): CHOL, HDL, LDLCALC, TRIG, CHOLHDL, LDLDIRECT in the last 72 hours. Thyroid Function Tests: No results for input(s): TSH, T4TOTAL, FREET4, T3FREE, THYROIDAB in the last 72 hours. Anemia Panel: No results for input(s): VITAMINB12, FOLATE, FERRITIN, TIBC, IRON, RETICCTPCT in the last 72 hours. Sepsis Labs: No results for input(s): PROCALCITON, LATICACIDVEN in the last 168 hours.  Recent Results (from the past 240 hour(s))  MRSA PCR Screening     Status: None   Collection Time: 05/02/17 12:30 PM  Result Value Ref Range Status   MRSA by PCR NEGATIVE NEGATIVE Final    Comment:        The GeneXpert MRSA Assay (FDA approved for NASAL specimens only), is one component of a comprehensive MRSA colonization surveillance program. It is not intended to diagnose MRSA infection nor to guide or monitor treatment for MRSA infections.          Radiology Studies: No results found.      Scheduled Meds: . enoxaparin (LOVENOX) injection  40 mg  Subcutaneous Q24H  . potassium chloride  20 mEq Oral Once  . sodium chloride flush  3 mL Intravenous Q12H   Continuous Infusions: . lactated ringers       LOS: 0 days       Mauricio Annett Gula, MD [facility] Pager [phone]  If 7PM-7AM, please contact night-coverage [email or web address] Password [email or web address] 05/02/2017, 2:30 PM

## 2017-05-02 NOTE — Progress Notes (Cosign Needed)
PROGRESS NOTE [facility]   [place]   ZSW:[id number] DOB: 1961/06/28  DOA: 05/01/2017 PCP: Maurice Small, MD   Brief Narrative: 56 year old female with no significant past medical history presented to the ED with intentional Benadryl overdose. States she took roughly 25 pills. Patient presented oriented x3 but somnolent and slurring her speech. She was unaccompanied on arrival. Denies past suicide attempts or thoughts of suicide. Denies any recent illness or ingestion of any other medications. Patient states the attempt was due to her step son at home who has ODD which makes home life chaotic. She recently started seeing a psychiatrist but does not take any psychiatric meds. Vitals on admission: BP 141/67, pulse 114, Resp rate 22, SpO2 97% on room air, Temp. 98.2.    Assessment & Plan:   Principal Problem:   Overdose   1. Intentional Overdose: Benadryl. Patient does not appear to have any current medical complications from the Benadryl consumption. Monitor for incontinence and decreased bowel sounds. Repeat BMP in AM.  Psych consulted. Sitter in room.    DVT prophylaxis: lovenox Code Status: Full Family Communication: None at bedside Disposition Plan: discharge home tomorrow pending psych consult   Consultants:   Psych  Procedures:   Antimicrobials:    Subjective: Patient states she is no longer suicidal. She says she couldn't take it at home last night but if she had to live it over she wouldn't take the benadryl.  She denies any past suicide attempts or suicidal ideations. Patient denies any chest pain, SOB, N/V/D. Patient hasn't had bowel movement since admission.   Objective: Vitals:   05/02/17 1026 05/02/17 1030 05/02/17 1100 05/02/17 1130  BP: (!) 150/66 132/67 132/64 138/78  Pulse: 89 82 93 76  Resp: (!) 22 (!) 25 (!) 23 (!) 21  Temp: 98.2 F (36.8 C)     TempSrc: Oral     SpO2: 98% 100% 100% 98%  Weight:      Height:         Intake/Output Summary (Last 24 hours) at 05/02/17 1159 Last data filed at 05/01/17 1950  Gross per 24 hour  Intake              500 ml  Output                0 ml  Net              500 ml   Filed Weights   05/01/17 1748  Weight: 197 lb (89.4 kg)    Examination:  General exam: Appears calm and in no acute distress HEENT: Mucus membranes moist Respiratory system: Clear to auscultation. No wheezes,crackle or rhonchi Cardiovascular system: S1 & S2 heard, RRR. No JVD, murmurs, rubs or gallops Gastrointestinal system: Abdomen is nondistended, soft and nontender. No organomegaly or masses felt. Hypoactive bowel sounds Central nervous system: Alert and oriented x3. No focal neurological deficits. Extremities: No pedal edema. Skin: No rashes, lesions or ulcers   Data Reviewed: I have personally reviewed following labs and imaging studies  CBC:  Recent Labs Lab 05/01/17 1814 05/02/17 0809  WBC 8.1 6.3  NEUTROABS 5.5  --   HGB 14.6 14.4  HCT 44.2 43.5  MCV 88.9 90.2  PLT 283 269   Basic Metabolic Panel:  Recent Labs Lab 05/01/17 1814 05/02/17 0809  NA 140 140  K 3.3* 3.8  CL 107 107  CO2 23 25  GLUCOSE 98 94  BUN 13 10  CREATININE 0.93 0.86  CALCIUM 9.1 8.8*   GFR: Estimated Creatinine Clearance: 83.5 mL/min (by C-G formula based on SCr of 0.86 mg/dL). Liver Function Tests:  Recent Labs Lab 05/01/17 1814  AST 23  ALT 21  ALKPHOS 91  BILITOT 0.7  PROT 7.2  ALBUMIN 4.3   No results for input(s): LIPASE, AMYLASE in the last 168 hours. No results for input(s): AMMONIA in the last 168 hours. Coagulation Profile: No results for input(s): INR, PROTIME in the last 168 hours. Cardiac Enzymes: No results for input(s): CKTOTAL, CKMB, CKMBINDEX, TROPONINI in the last 168 hours. BNP (last 3 results) No results for input(s): PROBNP in the last 8760 hours. HbA1C: No results for input(s): HGBA1C in the last 72 hours. CBG: No results for input(s): GLUCAP in  the last 168 hours. Lipid Profile: No results for input(s): CHOL, HDL, LDLCALC, TRIG, CHOLHDL, LDLDIRECT in the last 72 hours. Thyroid Function Tests: No results for input(s): TSH, T4TOTAL, FREET4, T3FREE, THYROIDAB in the last 72 hours. Anemia Panel: No results for input(s): VITAMINB12, FOLATE, FERRITIN, TIBC, IRON, RETICCTPCT in the last 72 hours. Sepsis Labs: No results for input(s): PROCALCITON, LATICACIDVEN in the last 168 hours.  No results found for this or any previous visit (from the past 240 hour(s)).       Radiology Studies: No results found.    Scheduled Meds: . enoxaparin (LOVENOX) injection  40 mg Subcutaneous Q24H  . sodium chloride flush  3 mL Intravenous Q12H   Continuous Infusions: . 0.9 % NaCl with KCl 20 mEq / L 100 mL/hr (05/02/17 0816)     LOS: 0 days     Luca Dyar, PA-s  If 7PM-7AM, please contact night-coverage [email or web address] Password [email or web address] 05/02/2017, 11:59 AM
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
## 2017-05-03 ENCOUNTER — Encounter (HOSPITAL_COMMUNITY): Payer: Self-pay | Admitting: *Deleted

## 2017-05-03 ENCOUNTER — Inpatient Hospital Stay (HOSPITAL_COMMUNITY)
Admission: AD | Admit: 2017-05-03 | Discharge: 2017-05-06 | DRG: 885 | Disposition: A | Discharge status: Home / Self Care | Payer: BLUE CROSS/BLUE SHIELD | Source: Intra-hospital | Attending: Psychiatry | Admitting: Psychiatry

## 2017-05-03 DIAGNOSIS — T450X2A Poisoning by antiallergic and antiemetic drugs, intentional self-harm, initial encounter: Secondary | ICD-10-CM | POA: Diagnosis not present

## 2017-05-03 DIAGNOSIS — F4323 Adjustment disorder with mixed anxiety and depressed mood: Secondary | ICD-10-CM | POA: Diagnosis not present

## 2017-05-03 DIAGNOSIS — Z96659 Presence of unspecified artificial knee joint: Secondary | ICD-10-CM | POA: Diagnosis present

## 2017-05-03 DIAGNOSIS — F332 Major depressive disorder, recurrent severe without psychotic features: Principal | ICD-10-CM | POA: Diagnosis present

## 2017-05-03 DIAGNOSIS — T50902A Poisoning by unspecified drugs, medicaments and biological substances, intentional self-harm, initial encounter: Secondary | ICD-10-CM

## 2017-05-03 DIAGNOSIS — E876 Hypokalemia: Secondary | ICD-10-CM | POA: Diagnosis not present

## 2017-05-03 DIAGNOSIS — T1491XA Suicide attempt, initial encounter: Secondary | ICD-10-CM | POA: Diagnosis not present

## 2017-05-03 LAB — BASIC METABOLIC PANEL
Anion gap: 7 (ref 5–15)
BUN: 11 mg/dL (ref 6–20)
CHLORIDE: 106 mmol/L (ref 101–111)
CO2: 27 mmol/L (ref 22–32)
Calcium: 8.9 mg/dL (ref 8.9–10.3)
Creatinine, Ser: 0.94 mg/dL (ref 0.44–1.00)
GFR calc Af Amer: >60 mL/min (ref >60)
GFR calc non Af Amer: >60 mL/min (ref >60)
GLUCOSE: 134 mg/dL — AB (ref 65–99)
POTASSIUM: 3.8 mmol/L (ref 3.5–5.1)
Sodium: 140 mmol/L (ref 135–145)

## 2017-05-03 MED ORDER — DULOXETINE HCL 30 MG PO CPEP: 30.0000 mg | ORAL_CAPSULE | Freq: Every day | ORAL | Status: DC

## 2017-05-03 MED ORDER — HYDROXYZINE HCL 25 MG PO TABS: 25.0000 mg | ORAL_TABLET | Freq: Four times a day (QID) | ORAL | Status: DC | PRN

## 2017-05-03 MED ORDER — MAGNESIUM HYDROXIDE 400 MG/5ML PO SUSP: 30.0000 mL | Freq: Every day | ORAL | Status: DC | PRN

## 2017-05-03 MED ORDER — TRAZODONE HCL 50 MG PO TABS: 50.0000 mg | ORAL_TABLET | Freq: Every evening | ORAL | Status: DC | PRN

## 2017-05-03 MED ORDER — ALUM & MAG HYDROXIDE-SIMETH 200-200-20 MG/5ML PO SUSP: 30.0000 mL | ORAL | Status: DC | PRN

## 2017-05-03 MED ORDER — ACETAMINOPHEN 325 MG PO TABS: 650.0000 mg | ORAL_TABLET | Freq: Four times a day (QID) | ORAL | Status: DC | PRN

## 2017-05-03 NOTE — Progress Notes (Signed)
Pt is up walking in the room with a steady gait.
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
## 2017-05-03 NOTE — Progress Notes (Signed)
Pts IV removed with dressing intact. Pt transported to [facility] with belongings.

## 2017-05-03 NOTE — Progress Notes (Signed)
[facility] to [employer].
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
## 2017-05-03 NOTE — Tx Team (Signed)
Initial Treatment Plan 05/03/2017 7:46 PM Dana Johnston [id number]    PATIENT STRESSORS: Marital or family conflict   PATIENT STRENGTHS: Ability for insight Average or above average intelligence Capable of independent living Metallurgist fund of knowledge Motivation for treatment/growth Supportive family/friends   PATIENT IDENTIFIED PROBLEMS: At risk for suicide  Ineffective coping  "How to better manage emotions with stepson"  "Cope with pressure"               DISCHARGE CRITERIA:  Ability to meet basic life and health needs Improved stabilization in mood, thinking, and/or behavior Motivation to continue treatment in a less acute level of care Need for constant or close observation no longer present  PRELIMINARY DISCHARGE PLAN: Outpatient therapy Return to previous living arrangement Return to previous work or school arrangements  PATIENT/FAMILY INVOLVEMENT: This treatment plan has been presented to and reviewed with the patient, Dana Johnston.  The patient and family have been given the opportunity to ask questions and make suggestions.  Carleene Overlie, RN 05/03/2017, 7:46 PM

## 2017-05-03 NOTE — Progress Notes (Signed)
Date: May 03, 2017 Chart reviewed for discharge orders: None found for case management. Clide Dales, [phone]

## 2017-05-03 NOTE — Progress Notes (Addendum)
Patient request to see psychiatrist " I was really groggy yesterday I just want to talk to him in clear mind." Psychiatrist agreeable to see patient today after three.  Patient has been accepted to [facility].  [id number] Voluntary Form faxed to [phone]. [place] scheduled for transport to pick up 5:30pm RN please call report [phone]   Vivi Barrack, Theresia Majors, MSW Clinical Social Worker 5E and Psychiatric Service Line [phone] 05/03/2017  1:51 PM

## 2017-05-03 NOTE — Care Management Note (Signed)
Case Management Note  Patient Details  Name: Dana Johnston MRN: [id number] Date of Birth: 1960/12/14  Subjective/Objective:                  Overdose  Action/Plan: Date:  May 03, 2017  Chart reviewed for concurrent status and case management needs.  Will continue to follow patient progress.  Discharge Planning: following for needs  Expected discharge date: [id number]  Marcelle Smiling, BSN, [place], [place]   [phone]   Expected Discharge Date:   (unknown)               Expected Discharge Plan:  Home/Self Care  In-House Referral:  Clinical Social Work  Discharge planning Services  CM Consult  Post Acute Care Choice:    Choice offered to:     DME Arranged:    DME Agency:     HH Arranged:    HH Agency:     Status of Service:  In process, will continue to follow  If discussed at Long Length of Stay Meetings, dates discussed:    Additional Comments:  Golda Acre, RN 05/03/2017, 9:10 AM
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
## 2017-05-04 DIAGNOSIS — F4323 Adjustment disorder with mixed anxiety and depressed mood: Secondary | ICD-10-CM

## 2017-05-04 NOTE — Progress Notes (Signed)
D: Patient states "I am less anxious than I was at admission.  It's better than I thought it would be."  She has been visible in the milieu and interacts well with her peers.  Patient denies any thoughts of self harm and feels her actions were "impulsive."  She states, "I have an outpatient provider lined up."  She is future oriented and hopes to discharge in a few days. Patient is pleasant and her mood appears stable.   A: Continue to monitor medication management and MD orders.  Safety checks completed every 15 minutes per protocol.  Offer support and encouragement as needed. R: Patient is receptive to staff; her behavior is appropriate.
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
## 2017-05-04 NOTE — Progress Notes (Signed)
Adult Psychoeducational Group Note  Date:  05/04/2017 Time:  12:30 PM  Group Topic/Focus:  Goals Group:   The focus of this group is to help patients establish daily goals to achieve during treatment and discuss how the patient can incorporate goal setting into their daily lives to aide in recovery.  Participation Level:  Active  Participation Quality:  Appropriate  Affect:  Appropriate  Cognitive:  Appropriate  Insight: Appropriate and Good  Engagement in Group:  Engaged  Modes of Intervention:  Discussion  Additional Comments:  Pt attended goals group and participated. Pt goal for today is to work self esteem. Pt shared she has a hard time believing in herself. Pt stated " I do not feel smart and beautiful, and learn how not to put myself down". Pt plans to work on self esteem worksheet. Pt rated her day 10. Pt shared she had a good night and laughed a lot with her peers. Pt was pleasant and appropriate in group.   Jonny Dearden A 05/04/2017, 12:30 PM

## 2017-05-04 NOTE — Progress Notes (Signed)
Dana Johnston had been up and in room and in bed for much of the evening, limited interaction or participation in milieu. Dana Johnston voiced no concerns this evening, did not request or receive any bedtime medications and did not verbalize any complaints of pain. A. Support and encouragement provided. R. Safety maintained, will continue to monitor.
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
## 2017-05-04 NOTE — Progress Notes (Signed)
Patient ID: Dana Johnston, female   DOB: 08-30-1961, 57 y.o.   MRN: [id number]  DAR: Pt. Denies SI/HI and A/V Hallucinations. She reports sleep is fair, appetite is poor, energy level is normal, and concentration is good. She rates depression 5/10, hopelessness 8/10, and anxiety 0/10. Patient does not report any pain or discomfort at this time. Support and encouragement provided to the patient. No scheduled medications to administer at this time. Patient is seen in the milieu and interacting with peers. Patient is pleasant and cooperative with staff. Q15 minute checks are maintained for safety.
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
## 2017-05-04 NOTE — Plan of Care (Signed)
Problem: Activity: Goal: Interest or engagement in activities will improve Outcome: Progressing Patient visible in the milieu and attending groups.

## 2017-05-05 LAB — TSH: TSH: 2.648 u[IU]/mL (ref 0.350–4.500)

## 2017-05-05 MED ORDER — SERTRALINE HCL 50 MG PO TABS
50.0000 mg | ORAL_TABLET | Freq: Every day | ORAL | Status: DC
  Administered 2017-05-05 – 2017-05-06 (×3): 50 mg via ORAL
  Filled 2017-05-05 (×4): qty 1
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
## 2017-05-05 NOTE — Progress Notes (Signed)
  [facility] Adult Case Management Discharge Plan :  Will you be returning to the same living situation after discharge:  Yes,  pt returning home At discharge, do you have transportation home?: Yes,  wife to pick up Do you have the ability to pay for your medications: Yes,  pt provided with prescriptions  Release of information consent forms completed and in the chart;  Patient's signature needed at discharge.  Patient to Follow up at: Follow-up Information    [facility].   Why:  6/21 at 10:00am for therapy with Roe Rutherford. Medication management appointment scheduled for 7/20 at 11:00am with Sanda Klein, NP. This was the first available appointment.  Contact information: [address] [place] [address] [phone]           Next level of care provider has access to [facility] Link:no  Safety Planning and Suicide Prevention discussed: Yes,  with wife; see SPE note  Have you used any form of tobacco in the last 30 days? (Cigarettes, Smokeless Tobacco, Cigars, and/or Pipes): No  Has patient been referred to the Quitline?: N/A patient is not a smoker  Patient has been referred for addiction treatment: N/A  Verdene Lennert 05/05/2017, 11:54 AM

## 2017-05-05 NOTE — Progress Notes (Addendum)
Dana Johnston is seen sitting alone in the dayroom. She is not interacting with anyone, per se, but she jumps up and says " yes" when this writer calls her name at 1100. A per MD order, she is given her first dose of zoloft 50 mg day QD She completed her daily assessment and on this she wrote she deneid having SI today and she rates her depression ,  hopelessness  And anxiety " 0/0/0", respectively.  R Pt denies active SI and willingness to not hurt self while in hospital.

## 2017-05-05 NOTE — Tx Team (Signed)
Interdisciplinary Treatment and Diagnostic Plan Update  05/05/2017 Time of Session: 9:30am Dana Johnston MRN: [id number]  Principal Diagnosis: Adjustment Disorder, with Depressed Mood,  Suicide Attempt   Secondary Diagnoses: Active Problems:   MDD (major depressive disorder), recurrent severe, without psychosis (HCC)   Current Medications:  Current Facility-Administered Medications  Medication Dose Route Frequency Provider Last Rate Last Dose  . acetaminophen (TYLENOL) tablet 650 mg  650 mg Oral Q6H PRN Laveda Abbe, NP      . alum & mag hydroxide-simeth (MAALOX/MYLANTA) 200-200-20 MG/5ML suspension 30 mL  30 mL Oral Q4H PRN Laveda Abbe, NP      . hydrOXYzine (ATARAX/VISTARIL) tablet 25 mg  25 mg Oral Q6H PRN Laveda Abbe, NP      . magnesium hydroxide (MILK OF MAGNESIA) suspension 30 mL  30 mL Oral Daily PRN Laveda Abbe, NP      . sertraline (ZOLOFT) tablet 50 mg  50 mg Oral Daily Cobos, Rockey Situ, MD   50 mg at 05/05/17 1006  . traZODone (DESYREL) tablet 50 mg  50 mg Oral QHS PRN Laveda Abbe, NP        PTA Medications: Prescriptions Prior to Admission  Medication Sig Dispense Refill Last Dose  . meloxicam (MOBIC) 15 MG tablet Take 15 mg by mouth daily.   05/01/2017 at Unknown time  . methotrexate (RHEUMATREX) 10 MG tablet Take 60 mg by mouth every 30 (thirty) days. Caution: Chemotherapy. Protect from light.   05/01/2017 at Unknown time  . zolmitriptan (ZOMIG) 5 MG tablet Take 5 mg by mouth daily as needed for migraine.        Treatment Modalities: Medication Management, Group therapy, Case management,  1 to 1 session with clinician, Psychoeducation, Recreational therapy.  Patient Stressors: Marital or family conflict  Patient Strengths: Ability for insight Average or above average intelligence Capable of independent living Metallurgist fund of knowledge Motivation for treatment/growth Supportive  family/friends  Physician Treatment Plan for Primary Diagnosis: Adjustment Disorder, with Depressed Mood,  Suicide Attempt  Long Term Goal(s): Improvement in symptoms so as ready for discharge  Short Term Goals: Ability to verbalize feelings will improve Ability to disclose and discuss suicidal ideas Ability to demonstrate self-control will improve Ability to identify and develop effective coping behaviors will improve Ability to maintain clinical measurements within normal limits will improve Ability to verbalize feelings will improve Ability to disclose and discuss suicidal ideas Ability to demonstrate self-control will improve Ability to identify and develop effective coping behaviors will improve Ability to maintain clinical measurements within normal limits will improve  Medication Management: Evaluate patient's response, side effects, and tolerance of medication regimen.  Therapeutic Interventions: 1 to 1 sessions, Unit Group sessions and Medication administration.  Evaluation of Outcomes: Progressing  Physician Treatment Plan for Secondary Diagnosis: Active Problems:   MDD (major depressive disorder), recurrent severe, without psychosis (HCC)   Long Term Goal(s): Improvement in symptoms so as ready for discharge  Short Term Goals: Ability to verbalize feelings will improve Ability to disclose and discuss suicidal ideas Ability to demonstrate self-control will improve Ability to identify and develop effective coping behaviors will improve Ability to maintain clinical measurements within normal limits will improve Ability to verbalize feelings will improve Ability to disclose and discuss suicidal ideas Ability to demonstrate self-control will improve Ability to identify and develop effective coping behaviors will improve Ability to maintain clinical measurements within normal limits will improve  Medication Management: Evaluate patient's response, side  effects, and  tolerance of medication regimen.  Therapeutic Interventions: 1 to 1 sessions, Unit Group sessions and Medication administration.  Evaluation of Outcomes: Progressing   RN Treatment Plan for Primary Diagnosis: Adjustment Disorder, with Depressed Mood,  Suicide Attempt  Long Term Goal(s): Knowledge of disease and therapeutic regimen to maintain health will improve  Short Term Goals: Ability to verbalize feelings will improve, Ability to disclose and discuss suicidal ideas and Ability to identify and develop effective coping behaviors will improve  Medication Management: RN will administer medications as ordered by provider, will assess and evaluate patient's response and provide education to patient for prescribed medication. RN will report any adverse and/or side effects to prescribing provider.  Therapeutic Interventions: 1 on 1 counseling sessions, Psychoeducation, Medication administration, Evaluate responses to treatment, Monitor vital signs and CBGs as ordered, Perform/monitor CIWA, COWS, AIMS and Fall Risk screenings as ordered, Perform wound care treatments as ordered.  Evaluation of Outcomes: Progressing   LCSW Treatment Plan for Primary Diagnosis: Adjustment Disorder, with Depressed Mood,  Suicide Attempt  Long Term Goal(s): Safe transition to appropriate next level of care at discharge, Engage patient in therapeutic group addressing interpersonal concerns.  Short Term Goals: Engage patient in aftercare planning with referrals and resources, Identify triggers associated with mental health/substance abuse issues and Increase skills for wellness and recovery  Therapeutic Interventions: Assess for all discharge needs, 1 to 1 time with Social worker, Explore available resources and support systems, Assess for adequacy in community support network, Educate family and significant other(s) on suicide prevention, Complete Psychosocial Assessment, Interpersonal group therapy.  Evaluation of  Outcomes: Progressing   Progress in Treatment: Attending groups: Yes  Participating in groups: Yes Taking medication as prescribed: Yes, MD continues to assess for medication changes as needed Toleration medication: Yes, no side effects reported at this time Family/Significant other contact made: Yes with wife Patient understands diagnosis: yes AEB seeking help with depression  Discussing patient identified problems/goals with staff: Yes Medical problems stabilized or resolved: Yes Denies suicidal/homicidal ideation: Yes Issues/concerns per patient self-inventory: None Other: N/A  New problem(s) identified: None identified at this time.   New Short Term/Long Term Goal(s): None identified at this time.   Discharge Plan or Barriers: Pt will return home and follow[facility]  Reason for Continuation of Hospitalization: Anxiety Depression Medication stabilization  Estimated Length of Stay: 1 day; est DC date 6/16  Attendees: Patient: 05/05/2017  11:49 AM  Physician: Dr. Jama Flavors 05/05/2017  11:49 AM  Nursing: Stacy Gardner, RN 05/05/2017  11:49 AM  RN Care Manager: Onnie Boer, RN 05/05/2017  11:49 AM  Social Worker: Vernie Shanks, LCSW; Jodelle Red, LCSWA 05/05/2017  11:49 AM  Recreational Therapist:  05/05/2017  11:49 AM  Other: Armandina Stammer, NP 05/05/2017  11:49 AM  Other:  05/05/2017  11:49 AM  Other: 05/05/2017  11:49 AM    Scribe for Treatment Team: Verdene Lennert, LCSW 05/05/2017 11:49 AM
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
## 2017-05-06 DIAGNOSIS — T1491XA Suicide attempt, initial encounter: Secondary | ICD-10-CM

## 2017-05-06 DIAGNOSIS — F332 Major depressive disorder, recurrent severe without psychotic features: Principal | ICD-10-CM

## 2017-05-06 DIAGNOSIS — T450X2A Poisoning by antiallergic and antiemetic drugs, intentional self-harm, initial encounter: Secondary | ICD-10-CM

## 2017-05-06 MED ORDER — SERTRALINE HCL 50 MG PO TABS: 50.0000 mg | ORAL_TABLET | Freq: Every day | ORAL | 0 refills | Status: DC

## 2017-05-06 MED ORDER — HYDROXYZINE HCL 25 MG PO TABS
25.0000 mg | ORAL_TABLET | Freq: Four times a day (QID) | ORAL | 0 refills | Status: DC | PRN
Start: 2017-05-06 — End: 2018-04-06

## 2017-05-06 MED ORDER — TRAZODONE HCL 50 MG PO TABS: 50.0000 mg | ORAL_TABLET | Freq: Every evening | ORAL | 0 refills | Status: DC | PRN

## 2017-05-06 NOTE — [facility] Suicide Risk Assessment (Signed)
[facility] Discharge Suicide Risk Assessment   Principal Problem: MDD (major depressive disorder), recurrent severe, without psychosis (HCC) Discharge Diagnoses:  Patient Active Problem List   Diagnosis Date Noted  . MDD (major depressive disorder), recurrent severe, without psychosis (HCC) [F33.2] 05/03/2017  . Overdose [T50.901A] 05/01/2017    Total Time spent with patient: 30 minutes  Musculoskeletal: Strength & Muscle Tone: within normal limits Gait & Station: normal Patient leans: N/A  Psychiatric Specialty Exam: ROS  Blood pressure 96/61, pulse 79, temperature 98.2 F (36.8 C), temperature source Oral, resp. rate 18, height 5' 3.25" (1.607 m), weight 87.5 kg (193 lb).Body mass index is 33.92 kg/m.  General Appearance: Casual  Eye Contact::  Good  Speech:  Clear and Coherent409  Volume:  Normal  Mood:  Anxious  Affect:  Appropriate and Congruent  Thought Process:  Goal Directed  Orientation:  Full (Time, Place, and Person)  Thought Content:  WDL and Logical  Suicidal Thoughts:  No  Homicidal Thoughts:  No  Memory:  Immediate;   Good Recent;   Good Remote;   Good  Judgement:  Good  Insight:  Good  Psychomotor Activity:  Normal  Concentration:  Good  Recall:  Good  Fund of Knowledge:Good  Language: Good  Akathisia:  No  Handed:  Right  AIMS (if indicated):     Assets:  Communication Skills Desire for Improvement Housing Physical Health Resilience Social Support Talents/Skills Transportation  Sleep:  Number of Hours: 6.75  Cognition: WNL  ADL's:  Intact   Mental Status Per Nursing Assessment::   On Admission:  Suicidal ideation indicated by patient, Suicide plan, Plan includes specific time, place, or method, Self-harm thoughts, Self-harm behaviors, Intention to act on suicide plan, Belief that plan would result in death  Demographic Factors:  Caucasian  Loss Factors: Loss of significant relationship  Historical Factors: NA  Risk Reduction Factors:    Responsible for children under 37 years of age, Religious beliefs about death, Employed, Living with another person, especially a relative, Positive social support, Positive therapeutic relationship and Positive coping skills or problem solving skills  Continued Clinical Symptoms:  Dysthymia  Cognitive Features That Contribute To Risk:  None    Suicide Risk:  Minimal: No identifiable suicidal ideation.  Patients presenting with no risk factors but with morbid ruminations; may be classified as minimal risk based on the severity of the depressive symptoms  Follow-up Information    [facility].   Why:  6/21 at 10:00am for therapy with Roe Rutherford. Medication management appointment scheduled for 7/20 at 11:00am with Sanda Klein, NP. This was the first available appointment.  Contact information: [address] [place] [address] [phone]           Plan Of Care/Follow-up recommendations:  Activity:  as tolerated Diet:  unchanged from the past  Patient is doing better on her medication.  She is going to groups.  She denies any suicidal thoughts or homicidal thought.  She like to discharge and she [facility] and [facility]. She is [facility] 54 year old [facility]  She has no tremors shakes or any concern from the medication.  Patient denies any hallucination or any paranoia.  Please see discharge summary for more details.  Jennipher Weatherholtz T., MD 05/06/2017, 9:19 AM

## 2017-05-06 NOTE — [facility] Group Notes (Signed)
   Date:  05/06/2017  Time:  1000  Type of Therapy:  Nurse Education / Identifying needs: The group focuses on teaching patients how to identify their needs and then how to develop the skills needed to get them met.  Participation Level:  Active  Participation Quality:  Appropriate  Affect:  Appropriate  Cognitive:  Alert  Insight:  Good  Engagement in Group:  Engaged  Modes of Intervention:  Education  Summary of Progress/Problems:  Dana Johnston 05/06/2017, 12:23 PM

## 2017-05-06 NOTE — Progress Notes (Signed)
D: Pt denies SI/HI/AVH. Pt is pleasant and cooperative. Pt goal with no concerns voiced. A: Pt was offered support and encouragement. Pt was encourage to attend groups. Q 15 minute checks were done for safety.  R:Pt attends groups and interacts well with peers and staff. Pt has no complaints.Pt receptive to treatment and safety maintained on unit.
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
## 2017-05-06 NOTE — Progress Notes (Signed)
DAR/DC note Dana Johnston has been up and visible on the unit.  Attending groups and interacting with peers.  She completed her self inventory and reported her depression, anxiety and hopelessness were all 0/10.  She reported that her goal for today was "be positive" and she will accomplish this goal by "being myself."  She denied any pain or discomfort.  She denied any SI/HI or A/V hallucinations.  Dana Johnston was D/C from the unit accompanied by family.  She was pleasant and cooperative.  D/C follow up paperwork reviewed with pt and copy sent as well as prescriptions.  She had no belongings in a locker. Q 15 min checks maintained until discharge.  She left the unit in no apparent distress.

## 2017-07-01 ENCOUNTER — Other Ambulatory Visit (HOSPITAL_COMMUNITY): Payer: Self-pay | Admitting: Psychiatry

## 2017-09-21 ENCOUNTER — Telehealth: Payer: Self-pay

## 2017-09-21 NOTE — Telephone Encounter (Signed)
Pre visit call completed 

## 2017-09-22 ENCOUNTER — Ambulatory Visit (INDEPENDENT_AMBULATORY_CARE_PROVIDER_SITE_OTHER): Payer: BLUE CROSS/BLUE SHIELD | Admitting: Family

## 2017-09-22 ENCOUNTER — Encounter: Payer: Self-pay | Admitting: Family

## 2017-09-22 VITALS — BP 134/59 | HR 67 | Temp 98.4°F | Resp 16 | Ht 64.5 in | Wt 191.2 lb

## 2017-09-22 DIAGNOSIS — Z91018 Allergy to other foods: Secondary | ICD-10-CM

## 2017-09-22 DIAGNOSIS — M1990 Unspecified osteoarthritis, unspecified site: Secondary | ICD-10-CM

## 2017-09-22 DIAGNOSIS — E669 Obesity, unspecified: Secondary | ICD-10-CM

## 2017-09-22 DIAGNOSIS — F332 Major depressive disorder, recurrent severe without psychotic features: Secondary | ICD-10-CM

## 2017-09-22 DIAGNOSIS — M069 Rheumatoid arthritis, unspecified: Secondary | ICD-10-CM | POA: Insufficient documentation

## 2017-09-22 DIAGNOSIS — F419 Anxiety disorder, unspecified: Secondary | ICD-10-CM | POA: Diagnosis not present

## 2017-09-22 DIAGNOSIS — Z23 Encounter for immunization: Secondary | ICD-10-CM

## 2017-09-22 DIAGNOSIS — F418 Other specified anxiety disorders: Secondary | ICD-10-CM | POA: Insufficient documentation

## 2017-09-22 MED ORDER — EPINEPHRINE 0.3 MG/0.3ML IJ SOAJ: INTRAMUSCULAR | 1 refills | Status: DC

## 2017-09-22 NOTE — Assessment & Plan Note (Signed)
Discussed increasing fruits/veggies in her diet as well as continuing to count her calories.  She reports that she tries to keep calories to 1350 or 1400/day. Advised that she increase frequency of her cardio and add some weights.

## 2017-09-22 NOTE — Progress Notes (Signed)
Subjective:    Patient ID: Dana Johnston, female    DOB: [birth year], 56 y.o.   MRN: [id number]  HPI  Dana Johnston is a 56 yr old female who presents today to establish care.    Depression-  Reports that depression symptoms began about 6 years ago.    Chart is reviewed.  She was admitted on 05/01/17 following an intentional overdose of benadryl.  She was evaluated by psychiatry and scheduled for outpatient follow up with [facility].  She also sees a Veterinary surgeon. Reports that her depression is well controlled. She also has hx of anxiety which she reports is well controlled.   Rheumatology- sees Dana Johnston for RA.    Nut allergy-  Has had neck/tongue/eye swelling.  Has seen allergist in the past.  Last reaction was 6-7 years ago. Tries to avoid nuts. Does not have an epipen.   S/p partial knee replacement.  This was done at [facility]  Concerned about weight-   Breakfast pack of lance crackers Lunch- sandwich or salad (has lunch catered in) Dinner- spaghetti with [place], grilled hamburgers  Feels like she eats too many sweets/bread  Walks 2 miles every other day.  Drinks diet pepsi.    Review of Systems  Constitutional: Negative for unexpected weight change.  HENT: Negative for hearing loss and rhinorrhea.   Eyes: Negative for visual disturbance.  Respiratory: Negative for cough.   Cardiovascular: Negative for leg swelling.  Gastrointestinal: Negative for constipation and diarrhea.  Genitourinary: Negative for dysuria and frequency.  Musculoskeletal: Positive for arthralgias.  Skin:       Reports that she has a rash on the back of her head  Neurological:       Occasional headaches, hx of migraines, currently stable  Hematological: Negative for adenopathy.  Psychiatric/Behavioral:       See HPI     Past Medical History:  Diagnosis Date  . Allergy   . Anxiety   . Arthritis   . Depression   . Hand pain   .  Osteoarthritis   . Polyarthralgia   . Rheumatoid arthritis [facility])      Social History   Social History  . Marital status: Married    Spouse name: N/A  . Number of children: N/A  . Years of education: N/A   Occupational History  . Not on file.   Social History Main Topics  . Smoking status: Never Smoker  . Smokeless tobacco: Never Used  . Alcohol use Yes     Comment: Occasional margarita  . Drug use: No  . Sexual activity: Yes    Partners: Female   Other Topics Concern  . 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 MEDIAL PARTIAL KNEE REPLACEMENT Left 03/2016    Family History  Problem Relation Age of Onset  . Heart disease Mother        MI at age 89, smoker   . Arthritis Father        died at 38 in MVA  .  Hyperlipidemia Father   . Hypertension Father   . Arthritis Maternal Grandmother   . Heart disease Maternal Grandfather     Allergies  Allergen Reactions  . Other Swelling    nuts  . Sulfa Antibiotics Swelling    Current Outpatient Prescriptions on File Prior to Visit  Medication Sig Dispense Refill  . hydrOXYzine (ATARAX/VISTARIL) 25 MG tablet Take 1 tablet (25 mg total) by mouth every 6 (six) hours as needed for anxiety. 30 tablet 0  . sertraline (ZOLOFT) 50 MG tablet Take 1 tablet (50 mg total) by mouth daily. 30 tablet 0  . traZODone (DESYREL) 50 MG tablet Take 1 tablet (50 mg total) by mouth at bedtime as needed for sleep. 30 tablet 0   No current facility-administered medications on file prior to visit.     BP (!) 134/59 (BP Location: Right Arm, Cuff Size: Large)   Pulse 67   Temp 98.4 F (36.9 C) (Oral)    Resp 16   Ht 5' 4.5" (1.638 m) Comment: w/shoes  Wt 191 lb 3.2 oz (86.7 kg)   SpO2 97%   BMI 32.31 kg/m       Objective:   Physical Exam  Constitutional: She is oriented to person, place, and time. She appears well-developed and well-nourished.  HENT:  Head: Normocephalic and atraumatic.  Right Ear: Tympanic membrane and ear canal normal.  Left Ear: Tympanic membrane and ear canal normal.  Mouth/Throat: No oropharyngeal exudate.  Eyes: No scleral icterus.  Neck: Neck supple. No thyromegaly present.  Cardiovascular: Normal rate, regular rhythm and normal heart sounds.   No murmur heard. Pulmonary/Chest: Effort normal and breath sounds normal. No respiratory distress. She has no wheezes.  Musculoskeletal: She exhibits no edema.  Some joint swelling noted in fingers  Lymphadenopathy:    She has no cervical adenopathy.  Neurological: She is alert and oriented to person, place, and time.  Psychiatric: She has a normal mood and affect. Her behavior is normal. Judgment and thought content normal.          Assessment & Plan:  Flu shot today.

## 2017-09-22 NOTE — Assessment & Plan Note (Signed)
Reports mild disease at this point. Not on meds for RA currently. She is followed by Azucena Fallen PA-C Rheumatology.

## 2017-09-22 NOTE — Patient Instructions (Signed)
Welcome to [facility]! Avoid nuts and keep epipen on hand in case of allergic reaction. Continue your follow up with rheumatology and psychiatry/counseling. Schedule a complete physical at the front desk.

## 2017-09-22 NOTE — Assessment & Plan Note (Signed)
Stable on zoloft and prn hydroxyzine. Managed by psychiatry.

## 2017-09-22 NOTE — Assessment & Plan Note (Signed)
Advised continued avoidance of nuts and that she carry an epi-pen. Rx sent to pharmacy.

## 2017-09-22 NOTE — Assessment & Plan Note (Signed)
Currently stable, followed by psychiatry and counseling. Encouraged pt to continue follow up.

## 2017-11-08 ENCOUNTER — Ambulatory Visit: Payer: Self-pay | Admitting: Family

## 2017-11-22 ENCOUNTER — Encounter: Payer: Self-pay | Admitting: Family

## 2017-11-22 ENCOUNTER — Other Ambulatory Visit (HOSPITAL_COMMUNITY)
Admission: RE | Admit: 2017-11-22 | Discharge: 2017-11-22 | Disposition: A | Discharge status: Home / Self Care | Payer: BLUE CROSS/BLUE SHIELD | Source: Ambulatory Visit | Attending: Family | Admitting: Family

## 2017-11-22 ENCOUNTER — Ambulatory Visit: Payer: BLUE CROSS/BLUE SHIELD | Admitting: Family

## 2017-11-22 VITALS — BP 124/64 | HR 71 | Temp 98.0°F | Resp 18 | Ht 65.0 in | Wt 194.6 lb

## 2017-11-22 DIAGNOSIS — R21 Rash and other nonspecific skin eruption: Secondary | ICD-10-CM

## 2017-11-22 DIAGNOSIS — Z0000 Encounter for general adult medical examination without abnormal findings: Secondary | ICD-10-CM

## 2017-11-22 DIAGNOSIS — E348 Other specified endocrine disorders: Secondary | ICD-10-CM | POA: Diagnosis not present

## 2017-11-22 DIAGNOSIS — Z01419 Encounter for gynecological examination (general) (routine) without abnormal findings: Secondary | ICD-10-CM | POA: Diagnosis present

## 2017-11-22 DIAGNOSIS — M1811 Unilateral primary osteoarthritis of first carpometacarpal joint, right hand: Secondary | ICD-10-CM | POA: Diagnosis not present

## 2017-11-22 DIAGNOSIS — Z0001 Encounter for general adult medical examination with abnormal findings: Secondary | ICD-10-CM

## 2017-11-22 DIAGNOSIS — M79644 Pain in right finger(s): Secondary | ICD-10-CM

## 2017-11-22 LAB — URINALYSIS, ROUTINE W REFLEX MICROSCOPIC
Bilirubin Urine: NEGATIVE
Hgb urine dipstick: NEGATIVE
KETONES UR: NEGATIVE
Leukocytes, UA: NEGATIVE
Nitrite: NEGATIVE
PH: 5.5 (ref 5.0–8.0)
RBC / HPF: NONE SEEN (ref 0–?)
Total Protein, Urine: NEGATIVE
UROBILINOGEN UA: 0.2 (ref 0.0–1.0)
Urine Glucose: NEGATIVE

## 2017-11-22 LAB — HEPATIC FUNCTION PANEL
ALT: 14 U/L (ref 0–35)
AST: 15 U/L (ref 0–37)
Albumin: 4.1 g/dL (ref 3.5–5.2)
Alkaline Phosphatase: 113 U/L (ref 39–117)
BILIRUBIN DIRECT: 0.1 mg/dL (ref 0.0–0.3)
BILIRUBIN TOTAL: 0.7 mg/dL (ref 0.2–1.2)
Total Protein: 6.9 g/dL (ref 6.0–8.3)

## 2017-11-22 LAB — BASIC METABOLIC PANEL
BUN: 14 mg/dL (ref 6–23)
CALCIUM: 9 mg/dL (ref 8.4–10.5)
CO2: 29 mEq/L (ref 19–32)
CREATININE: 0.83 mg/dL (ref 0.40–1.20)
Chloride: 103 mEq/L (ref 96–112)
GFR: 75.88 mL/min (ref >60.00)
Glucose, Bld: 90 mg/dL (ref 70–99)
Potassium: 4.1 mEq/L (ref 3.5–5.1)
Sodium: 139 mEq/L (ref 135–145)

## 2017-11-22 LAB — CBC WITH DIFFERENTIAL/PLATELET
BASOS ABS: 0 10*3/uL (ref 0.0–0.1)
BASOS PCT: 1 % (ref 0.0–3.0)
Eosinophils Absolute: 0.3 10*3/uL (ref 0.0–0.7)
Eosinophils Relative: 6 % — ABNORMAL HIGH (ref 0.0–5.0)
HEMATOCRIT: 44.1 % (ref 36.0–46.0)
HEMOGLOBIN: 14.4 g/dL (ref 12.0–15.0)
LYMPHS PCT: 23.1 % (ref 12.0–46.0)
Lymphs Abs: 1.1 10*3/uL (ref 0.7–4.0)
MCHC: 32.6 g/dL (ref 30.0–36.0)
MCV: 91.1 fl (ref 78.0–100.0)
MONOS PCT: 7.6 % (ref 3.0–12.0)
Monocytes Absolute: 0.4 10*3/uL (ref 0.1–1.0)
NEUTROS ABS: 3.1 10*3/uL (ref 1.4–7.7)
Neutrophils Relative %: 62.3 % (ref 43.0–77.0)
PLATELETS: 296 10*3/uL (ref 150.0–400.0)
RBC: 4.85 Mil/uL (ref 3.87–5.11)
RDW: 13.1 % (ref 11.5–15.5)
WBC: 5 10*3/uL (ref 4.0–10.5)

## 2017-11-22 LAB — LIPID PANEL
CHOL/HDL RATIO: 4
Cholesterol: 180 mg/dL (ref 0–200)
HDL: 44.8 mg/dL (ref >39.00)
LDL CALC: 114 mg/dL — AB (ref 0–99)
NONHDL: 135.57
Triglycerides: 107 mg/dL (ref 0.0–149.0)
VLDL: 21.4 mg/dL (ref 0.0–40.0)

## 2017-11-22 LAB — TSH: TSH: 2.44 u[IU]/mL (ref 0.35–4.50)

## 2017-11-22 MED ORDER — CALCIUM CARBONATE-VITAMIN D 600-400 MG-UNIT PO TABS: 1.0000 | ORAL_TABLET | Freq: Two times a day (BID) | ORAL | Status: DC

## 2017-11-22 NOTE — Progress Notes (Signed)
Subjective:    Patient ID: Dana Johnston, female    DOB: 02-25-1961, 57 y.o.   MRN: [id number]  HPI   Patient presents today for complete physical.  Immunizations: tetanus 2013, flu shot up to date Diet: reports improvement in her diet. Wt Readings from Last 3 Encounters:  11/22/17 194 lb 9.6 oz (88.3 kg)  09/22/17 191 lb 3.2 oz (86.7 kg)  05/01/17 197 lb (89.4 kg)  Exercise: started walking recently Colonoscopy:  due Dexa: due Pap Smear: due Mammogram:  Due  Vision: up to date Dental: up to date  OA- reports 5 year history of pain in the base of the right thumb.  Has had 2 steroid injections and has worn brace with only minimal improvement.    Review of Systems  Constitutional: Negative for unexpected weight change.  HENT: Negative for hearing loss and rhinorrhea.   Eyes: Negative for visual disturbance.  Respiratory: Negative for cough.   Cardiovascular: Negative for leg swelling.  Gastrointestinal: Negative for blood in stool, constipation and diarrhea.  Genitourinary: Negative for dysuria, frequency and hematuria.  Musculoskeletal:       See HPI  Skin:       Reports that she gets rash on scalp which is improved.  Has had rash on shin x 3 weeks.    Neurological: Negative for headaches.  Hematological: Negative for adenopathy.  Psychiatric/Behavioral:       Denies current depression/anxiety symptoms.    Past Medical History:  Diagnosis Date  . Allergy   . Anxiety   . Arthritis   . Depression   . Hand pain   . Osteoarthritis   . Polyarthralgia   . Rheumatoid arthritis (HCC)      Social History   Socioeconomic History  . Marital status: Married    Spouse name: Not on file  . Number of children: Not on file  . Years of education: Not on file  . Highest education level: Not on file  Social Needs  . Financial resource strain: Not on file  . Food insecurity - worry: Not on file  . Food insecurity - inability: Not on file  . Transportation needs -  medical: Not on file  . Transportation needs - non-medical: Not on file  Occupational History  . Not on file  Tobacco Use  . Smoking status: Never Smoker  . Smokeless tobacco: Never Used  Substance and Sexual Activity  . Alcohol use: Yes    Comment: Occasional margarita  . Drug use: No  . Sexual activity: Yes    Partners: Female  Other Topics Concern  . 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 MEDIAL PARTIAL KNEE REPLACEMENT Left 03/2016    Family History  Problem Relation Age of Onset  . Heart disease Mother        MI at age 79, smoker   . Arthritis Father        died at 79 in MVA  . Hyperlipidemia Father   . Hypertension Father   . Arthritis Maternal Grandmother   . Heart disease Maternal Grandfather  Allergies  Allergen Reactions  . Other Swelling    nuts  . Sulfa Antibiotics Swelling    Current Outpatient Medications on File Prior to Visit  Medication Sig Dispense Refill  . EPINEPHrine 0.3 mg/0.3 mL IJ SOAJ injection Inject once as needed for severe allergic reaction 2 Device 1  . hydrOXYzine (ATARAX/VISTARIL) 25 MG tablet Take 1 tablet (25 mg total) by mouth every 6 (six) hours as needed for anxiety. 30 tablet 0  . Ibuprofen-Famotidine (DUEXIS) 800-26.6 MG TABS Take 1 tablet by mouth daily.    . sertraline (ZOLOFT) 50 MG tablet Take 1 tablet (50 mg total) by mouth daily. 30 tablet 0  . traZODone (DESYREL) 50 MG tablet Take 1 tablet (50 mg total) by mouth at bedtime as needed for sleep. 30 tablet 0   No current facility-administered medications on file prior to visit.     BP 124/64 (BP Location: Right Arm, Cuff Size: Large)    Pulse 71   Temp 98 F (36.7 C) (Oral)   Resp 18   Ht 5\' 5"  (1.651 m)   Wt 194 lb 9.6 oz (88.3 kg)   SpO2 98%   BMI 32.38 kg/m       Objective:   Physical Exam   Physical Exam  Constitutional: She is oriented to person, place, and time. She appears well-developed and well-nourished. No distress.  HENT:  Head: Normocephalic and atraumatic.  Right Ear: Tympanic membrane and ear canal normal.  Left Ear: Tympanic membrane and ear canal normal.  Mouth/Throat: Oropharynx is clear and moist.  Eyes: Pupils are equal, round, and reactive to light. No scleral icterus.  Neck: Normal range of motion. No thyromegaly present.  Cardiovascular: Normal rate and regular rhythm.   No murmur heard. Pulmonary/Chest: Effort normal and breath sounds normal. No respiratory distress. He has no wheezes. She has no rales. She exhibits no tenderness.  Abdominal: Soft. Bowel sounds are normal. She exhibits no distension and no mass. There is no tenderness. There is no rebound and no guarding.  Musculoskeletal: She exhibits no edema.  Lymphadenopathy:    She has no cervical adenopathy.  Neurological: She is alert and oriented to person, place, and time. She has normal patellar reflexes. She exhibits normal muscle tone. Coordination normal.  Skin: Skin is warm and dry. Mild rash noted on right shin. Psychiatric: She has a normal mood and affect. Her behavior is normal. Judgment and thought content normal.  Breasts: Examined lying Right: Without masses, retractions, discharge or axillary adenopathy.  Left: Without masses, retractions, discharge or axillary adenopathy.  Inguinal/mons: Normal without inguinal adenopathy  External genitalia: Normal  BUS/Urethra/Skene's glands: Normal  Bladder: Normal  Vagina: Normal  Cervix: Normal, posterior Uterus: normal in size, shape and contour. Midline and mobile  Adnexa/parametria:  Rt: Without masses or tenderness.  Lt: Without masses or tenderness.  Anus and  perineum: Normal            Assessment & Plan:   Preventive care-patient was counseled on importance of healthy diet, exercise, and weight loss.  She will be referred for bone density, mammogram, colonoscopy.  We will obtain routine lab work.  Pap was performed today and will be sent for co-testing.  Advised patient to add Caltrate 600 mg plus D twice daily for bone health.  Immunizations reviewed and up-to-date.  Osteoarthritis of thumb-uncontrolled.  Will refer to hand surgeon for further evaluation.  Skin rash-advised patient to apply over-the-counter hydrocortisone twice daily and to contact us if symptoms worsen  or fail to improve.    Assessment & Plan:

## 2017-11-22 NOTE — Patient Instructions (Addendum)
You will be contacted about your referral to the hand specialist and referral for colonoscopy. Schedule mammogram and bone density on the first floor. Start caltrate 600mg  + D twice daily.   Please complete lab work prior to leaving.  Apply otc hydrocortisone twice daily to rash on right shin. Call if symptoms worsen or fail to improve. Please continue to work on healthy diet, exercise and weight loss.

## 2017-11-24 ENCOUNTER — Encounter: Payer: Self-pay | Admitting: Family

## 2017-11-24 LAB — CYTOLOGY - PAP
Diagnosis: NEGATIVE
HPV: NOT DETECTED

## 2017-11-24 NOTE — Progress Notes (Signed)
Letter mailed out to patient.

## 2017-11-28 ENCOUNTER — Ambulatory Visit (HOSPITAL_BASED_OUTPATIENT_CLINIC_OR_DEPARTMENT_OTHER)
Admission: RE | Admit: 2017-11-28 | Discharge: 2017-11-28 | Disposition: A | Discharge status: Home / Self Care | Payer: BLUE CROSS/BLUE SHIELD | Source: Ambulatory Visit | Attending: Family | Admitting: Family

## 2017-11-28 DIAGNOSIS — Z1231 Encounter for screening mammogram for malignant neoplasm of breast: Secondary | ICD-10-CM | POA: Diagnosis present

## 2017-11-28 DIAGNOSIS — E348 Other specified endocrine disorders: Secondary | ICD-10-CM

## 2017-11-28 DIAGNOSIS — Z0000 Encounter for general adult medical examination without abnormal findings: Secondary | ICD-10-CM

## 2017-11-29 ENCOUNTER — Encounter: Payer: Self-pay | Admitting: Family

## 2017-12-01 NOTE — Progress Notes (Signed)
Letter mailed out to patient.

## 2017-12-25 ENCOUNTER — Encounter: Payer: Self-pay | Admitting: Family

## 2018-04-06 ENCOUNTER — Ambulatory Visit: Payer: BLUE CROSS/BLUE SHIELD | Admitting: Medical

## 2018-04-06 ENCOUNTER — Encounter: Payer: Self-pay | Admitting: Medical

## 2018-04-06 VITALS — BP 126/49 | HR 59 | Temp 98.1°F | Resp 16 | Ht 65.0 in | Wt 190.8 lb

## 2018-04-06 DIAGNOSIS — W540XXA Bitten by dog, initial encounter: Secondary | ICD-10-CM

## 2018-04-06 DIAGNOSIS — S71152A Open bite, left thigh, initial encounter: Secondary | ICD-10-CM

## 2018-04-06 DIAGNOSIS — L089 Local infection of the skin and subcutaneous tissue, unspecified: Secondary | ICD-10-CM | POA: Diagnosis not present

## 2018-04-06 DIAGNOSIS — T7840XA Allergy, unspecified, initial encounter: Secondary | ICD-10-CM

## 2018-04-06 MED ORDER — AMOXICILLIN-POT CLAVULANATE 875-125 MG PO TABS: 1.0000 | ORAL_TABLET | Freq: Two times a day (BID) | ORAL | 0 refills | Status: DC

## 2018-04-06 MED ORDER — PREDNISONE 10 MG PO TABS: ORAL_TABLET | ORAL | 0 refills | Status: DC

## 2018-04-06 MED ORDER — HYDROXYZINE HCL 25 MG PO TABS: 25.0000 mg | ORAL_TABLET | Freq: Three times a day (TID) | ORAL | 0 refills | Status: DC | PRN

## 2018-04-06 NOTE — Progress Notes (Signed)
   Subjective:    Patient ID: Dana Johnston, female    DOB: Oct 29, 1961, 57 y.o.   MRN: [id number]  HPI  Pt in for rash on back of neck for about 6 days. Area hurts and itches.  Patient mentions that the area on her neck was itching before it became tender and painful.  No suspicous exposure. Rash is bilateral neck. Extending toward jaws.  Also dog bite to her left distal thigh. About 3 days ago.She was breaking up fight between her small dog and boxer/shepherd. Her small dog is up to date on all vaccines. Did not seek treatment for the bite.     Review of Systems  Constitutional: Negative for chills, fatigue and fever.  Respiratory: Negative for cough, chest tightness, shortness of breath and wheezing.   Cardiovascular: Negative for chest pain and palpitations.  Skin:       Right lower extremity dog bite.  Rash behind her neck that itches.       Objective:   Physical Exam  General- No acute distress. Pleasant patient. Lungs- Clear, even and unlabored. Heart- regular rate and rhythm. Neurologic- CNII- XII grossly intact.  Skin- patient has slight pinkish rash on the back of her neck that extends to both angle of mandible regions.  Some scattered papules and into the scalp on close inspection she has scattered inflamed follicles.  These areas are slightly tender to palpation.  No severe induration.  No obvious warmth.  Left lower extremity-just above her left patella she has a 3.5 cm wide area of redness and warmth.  Faint appearance of teeth bite mark impressions but there is no obvious large puncture wounds.  There is slightly indurated.  Slightly tender.        Assessment & Plan:   You do appear to have probable allergic reaction in the region of your posterior neck extending towards both sides of your mandibles.  The exact etiology of your  allergic reaction is  unknown . We gave you 40 mg depo-medrol im injection. I am also prescribing   4-day taper dose of oral prednisone  and hydroxyzine for itching. Your rash should gradually improve. If worsening or expanding  please notify us. If your rash reoccurs intermittently and no cause is identified then could consider allergist referral.  In addition you do appear to have skin infection in this area with scattered folliculitis extending into the scalp.  Augmentin antibiotic should help with the skin infection component.  Also Augmentin antibiotic should help with your dog bite which looks to be early infected as well.   Follow up in 7 days or as needed Follow up in 7 days or as needed.

## 2018-04-06 NOTE — Patient Instructions (Signed)
You do appear to have probable allergic reaction in the region of your posterior neck extending towards both sides of your mandibles.  The exact etiology of your  allergic reaction is  unknown . We gave you 40 mg depo-medrol im injection. I am also prescribing   4-day taper dose of oral prednisone and hydroxyzine for itching. Your rash should gradually improve. If worsening or expanding  please notify us. If your rash reoccurs intermittently and no cause is identified then could consider allergist referral.  In addition you do appear to have skin infection in this area with scattered folliculitis extending into the scalp.  Augmentin antibiotic should help with the skin infection component.  Also Augmentin antibiotic should help with your dog bite which looks to be early infected as well.  Follow up in 7 days or as needed.

## 2018-08-27 HISTORY — PX: WRIST SURGERY: SHX841

## 2018-11-26 ENCOUNTER — Encounter: Payer: Self-pay | Admitting: Family

## 2018-11-26 ENCOUNTER — Ambulatory Visit (INDEPENDENT_AMBULATORY_CARE_PROVIDER_SITE_OTHER): Payer: BLUE CROSS/BLUE SHIELD | Admitting: Family

## 2018-11-26 VITALS — BP 124/69 | HR 90 | Temp 98.2°F | Resp 16 | Ht 65.0 in | Wt 193.0 lb

## 2018-11-26 DIAGNOSIS — Z23 Encounter for immunization: Secondary | ICD-10-CM

## 2018-11-26 DIAGNOSIS — Z0001 Encounter for general adult medical examination with abnormal findings: Secondary | ICD-10-CM

## 2018-11-26 DIAGNOSIS — R9431 Abnormal electrocardiogram [ECG] [EKG]: Secondary | ICD-10-CM

## 2018-11-26 DIAGNOSIS — Z0000 Encounter for general adult medical examination without abnormal findings: Secondary | ICD-10-CM

## 2018-11-26 DIAGNOSIS — F418 Other specified anxiety disorders: Secondary | ICD-10-CM

## 2018-11-26 LAB — URINALYSIS, ROUTINE W REFLEX MICROSCOPIC
Bilirubin Urine: NEGATIVE
HGB URINE DIPSTICK: NEGATIVE
Ketones, ur: NEGATIVE
Nitrite: NEGATIVE
RBC / HPF: NONE SEEN (ref 0–?)
SPECIFIC GRAVITY, URINE: 1.02 (ref 1.000–1.030)
Total Protein, Urine: NEGATIVE
Urine Glucose: NEGATIVE
Urobilinogen, UA: 0.2 (ref 0.0–1.0)
pH: 6 (ref 5.0–8.0)

## 2018-11-26 LAB — LIPID PANEL
CHOL/HDL RATIO: 5
Cholesterol: 195 mg/dL (ref 0–200)
HDL: 39.9 mg/dL (ref >39.00)
LDL CALC: 126 mg/dL — AB (ref 0–99)
NONHDL: 154.89
Triglycerides: 146 mg/dL (ref 0.0–149.0)
VLDL: 29.2 mg/dL (ref 0.0–40.0)

## 2018-11-26 LAB — CBC WITH DIFFERENTIAL/PLATELET
BASOS ABS: 0.1 10*3/uL (ref 0.0–0.1)
Basophils Relative: 1.9 % (ref 0.0–3.0)
Eosinophils Absolute: 0.2 10*3/uL (ref 0.0–0.7)
Eosinophils Relative: 4.7 % (ref 0.0–5.0)
HEMATOCRIT: 42.5 % (ref 36.0–46.0)
HEMOGLOBIN: 14.5 g/dL (ref 12.0–15.0)
LYMPHS PCT: 36.6 % (ref 12.0–46.0)
Lymphs Abs: 1.7 10*3/uL (ref 0.7–4.0)
MCHC: 34.1 g/dL (ref 30.0–36.0)
MCV: 86.5 fl (ref 78.0–100.0)
MONOS PCT: 11.1 % (ref 3.0–12.0)
Monocytes Absolute: 0.5 10*3/uL (ref 0.1–1.0)
Neutro Abs: 2.1 10*3/uL (ref 1.4–7.7)
Neutrophils Relative %: 45.7 % (ref 43.0–77.0)
Platelets: 315 10*3/uL (ref 150.0–400.0)
RBC: 4.91 Mil/uL (ref 3.87–5.11)
RDW: 13 % (ref 11.5–15.5)
WBC: 4.6 10*3/uL (ref 4.0–10.5)

## 2018-11-26 LAB — HEPATIC FUNCTION PANEL
ALT: 13 U/L (ref 0–35)
AST: 13 U/L (ref 0–37)
Albumin: 4.1 g/dL (ref 3.5–5.2)
Alkaline Phosphatase: 110 U/L (ref 39–117)
Bilirubin, Direct: 0.1 mg/dL (ref 0.0–0.3)
TOTAL PROTEIN: 6.7 g/dL (ref 6.0–8.3)
Total Bilirubin: 0.5 mg/dL (ref 0.2–1.2)

## 2018-11-26 LAB — BASIC METABOLIC PANEL
BUN: 14 mg/dL (ref 6–23)
CHLORIDE: 106 meq/L (ref 96–112)
CO2: 28 mEq/L (ref 19–32)
Calcium: 9.5 mg/dL (ref 8.4–10.5)
Creatinine, Ser: 0.76 mg/dL (ref 0.40–1.20)
GFR: 83.7 mL/min (ref >60.00)
GLUCOSE: 103 mg/dL — AB (ref 70–99)
Potassium: 4.2 mEq/L (ref 3.5–5.1)
SODIUM: 140 meq/L (ref 135–145)

## 2018-11-26 LAB — TSH: TSH: 1.52 u[IU]/mL (ref 0.35–4.50)

## 2018-11-26 MED ORDER — IBUPROFEN-FAMOTIDINE 800-26.6 MG PO TABS: 1.0000 | ORAL_TABLET | Freq: Every day | ORAL | 0 refills | Status: DC

## 2018-11-26 NOTE — Addendum Note (Signed)
Addended by: Wilford Corner on: 11/26/2018 10:16 AM   Modules accepted: Orders

## 2018-11-26 NOTE — Patient Instructions (Addendum)
Please schedule a routine dental exam.  You should be contacted about scheduling your echocardiogram and colonoscopy. Schedule mammogram on the first floor.  Continue to work on [employer], regular exercise and weight loss. Complete lab work [employer] trader to leaving.   Psychiatric Services:  [facility], [address] Katherine Basset [place], [place]   [phone] [phone] [facility], [address] [place], [place] [phone] [facility], [address] [place], [place] [phone]

## 2018-11-26 NOTE — Progress Notes (Signed)
Subjective:    Patient ID: Dana Johnston, female    DOB: 08/10/1961, 58 y.o.   MRN: [id number]  HPI  Patient presents today for complete physical.  Immunizations: would like shingrix today. Flu shot today, tetanus up to date Diet: reports that  Wt Readings from Last 3 Encounters:  11/26/18 193 lb (87.5 kg)  04/06/18 190 lb 12.8 oz (86.5 kg)  11/22/17 194 lb 9.6 oz (88.3 kg)  Exercise: walks 2-3 miles a day, occasional hiking Colonoscopy: due Dexa: 1/19 Pap Smear:11/22/17 Mammogram: 11/28/17 Dental: due Vision: 9/19  Depression- patient was following at the [facility] in [place].  Stopped zoloft in August/september.  Reports that she stopped in August.  Reports tha there mood was not really that different when she was on the zoloft.     Review of Systems  Constitutional: Negative for unexpected weight change.  HENT: Negative for hearing loss and rhinorrhea.   Eyes: Negative for visual disturbance.  Respiratory: Negative for cough and shortness of breath.   Cardiovascular: Negative for chest pain and leg swelling.  Gastrointestinal: Negative for blood in stool, constipation, diarrhea, nausea and vomiting.  Genitourinary: Negative for dysuria, frequency and hematuria.  Musculoskeletal: Negative for arthralgias and myalgias.  Skin: Negative for rash.  Neurological: Negative for headaches.  Hematological: Negative for adenopathy.  Psychiatric/Behavioral:       Reports that her oldest son (adopted) was diagnosed with bipolar disorder.  This is causing her anxiety. He is 17 and will move out after he complete's high school.    Past Medical History:  Diagnosis Date  . Allergy   . Anxiety   . Arthritis   . Depression   . Hand pain   . Osteoarthritis   . Polyarthralgia   . Rheumatoid arthritis (HCC)      Social History   Socioeconomic History  . Marital status: Married    Spouse name: Not on file  . Number of children: Not on file  . Years of education: Not on file    . Highest education level: Not on file  Occupational History  . Not on file  Social Needs  . Financial resource strain: Not on file  . Food insecurity:    Worry: Not on file    Inability: Not on file  . Transportation needs:    Medical: Not on file    Non-medical: Not on file  Tobacco Use  . Smoking status: Never Smoker  . Smokeless tobacco: Never Used  Substance and Sexual Activity  . Alcohol use: Yes    Comment: Occasional margarita  . Drug use: No  . Sexual activity: Yes    Partners: Female  Lifestyle  . Physical activity:    Days per week: Not on file    Minutes per session: Not on file  . Stress: Not on file  Relationships  . Social connections:    Talks on phone: Not on file    Gets together: Not on file    Attends religious service: Not on file    Active member of club or organization: Not on file    Attends meetings of clubs or organizations: Not on file    Relationship status: Not on file  . Intimate partner violence:    Fear of current or ex partner: Not on file    Emotionally abused: Not on file    Physically abused: Not on file    Forced sexual activity: Not on file  Other Topics Concern  . 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 MEDIAL PARTIAL KNEE REPLACEMENT Left 03/2016  . WRIST SURGERY Right 08/27/2018    Family History  Problem Relation Age of Onset  . Heart disease Mother        MI at age 93, smoker   . Arthritis Father        died at 39 in MVA  . Hyperlipidemia Father   . Hypertension Father   . Arthritis Maternal Grandmother   . Heart disease Maternal Grandfather     Allergies  Allergen Reactions   . Other Swelling    nuts  . Sulfa Antibiotics Swelling    Current Outpatient Medications on File Prior to Visit  Medication Sig Dispense Refill  . Calcium Carbonate-Vitamin D (CALTRATE 600+D) 600-400 MG-UNIT tablet Take 1 tablet by mouth 2 (two) times daily.    . hydrOXYzine (ATARAX/VISTARIL) 25 MG tablet Take 1 tablet (25 mg total) by mouth 3 (three) times daily as needed. 30 tablet 0  . sertraline (ZOLOFT) 50 MG tablet Take 1 tablet (50 mg total) by mouth daily. 30 tablet 0   No current facility-administered medications on file prior to visit.     BP 124/69 (BP Location: Right Arm, Patient Position: Sitting, Cuff Size: Large)   Pulse 90   Temp 98.2 F (36.8 C) (Oral)   Resp 16   Ht 5\' 5"  (1.651 m)   Wt 193 lb (87.5 kg)   SpO2 98%   BMI 32.12 kg/m       Objective:   Physical Exam  Physical Exam  Constitutional: She is oriented to person, place, and time. She appears well-developed and well-nourished. No distress.  HENT:  Head: Normocephalic and atraumatic.  Right Ear: Tympanic membrane and ear canal normal.  Left Ear: Tympanic membrane and ear canal normal.  Mouth/Throat: Oropharynx is clear and moist.  Eyes: Pupils are equal, round, and reactive to light. No scleral icterus.  Neck: Normal range of motion. No thyromegaly present.  Cardiovascular: Normal rate and regular rhythm.   No murmur heard. Pulmonary/Chest: Effort normal and breath sounds normal. No respiratory distress. He has no wheezes. She has no rales. She exhibits no tenderness.  Abdominal: Soft. Bowel sounds are normal. She exhibits no distension and no mass. There is no tenderness. There is no rebound and no guarding.  Musculoskeletal: She exhibits no edema.  Lymphadenopathy:    She has no cervical adenopathy.  Neurological: She is alert and oriented to person, place, and time. She has normal patellar reflexes. She exhibits normal muscle tone. Coordination normal.  Skin: Skin is warm and dry.   Psychiatric: She has a normal mood and affect. Her behavior is normal. Judgment and thought content normal.  Breasts: Examined lying Right: Without masses, retractions, discharge or axillary adenopathy.  Left: Without masses, retractions, discharge or axillary adenopathy.  Pelvic: deferred      Assessment & Plan:   Preventive care-discussed healthy diet, ongoing exercise, and weight loss.  Will obtain routine lab work.  She is due for annual mammogram.  Pap is up-to-date.  Will begin Shingrix  series today.  Flu shot updated.  Recommended routine dental exam.  Abnormal EKG- EKG is personally reviewed.  Note is made of some depressed T waves in V1 and V2 as well as some mild ST elevation in those leads.  This EKG is similar to her previous EKGs on file.  Will refer for 2D echo for further evaluation.  Depression/anxiety-patient was given numbers for psychiatry to call to establish with a new provider.  Fair control at this point off of medication.      Assessment & Plan:

## 2018-11-30 ENCOUNTER — Ambulatory Visit (HOSPITAL_BASED_OUTPATIENT_CLINIC_OR_DEPARTMENT_OTHER)
Admission: RE | Admit: 2018-11-30 | Discharge: 2018-11-30 | Disposition: A | Discharge status: Home / Self Care | Payer: BLUE CROSS/BLUE SHIELD | Source: Ambulatory Visit | Attending: Family | Admitting: Family

## 2018-11-30 DIAGNOSIS — Z0000 Encounter for general adult medical examination without abnormal findings: Secondary | ICD-10-CM | POA: Insufficient documentation

## 2018-12-23 ENCOUNTER — Other Ambulatory Visit: Payer: Self-pay | Admitting: Family

## 2019-01-09 ENCOUNTER — Encounter: Payer: Self-pay | Admitting: Family

## 2019-01-25 ENCOUNTER — Ambulatory Visit (INDEPENDENT_AMBULATORY_CARE_PROVIDER_SITE_OTHER): Payer: BLUE CROSS/BLUE SHIELD | Admitting: *Deleted

## 2019-01-25 DIAGNOSIS — Z23 Encounter for immunization: Secondary | ICD-10-CM

## 2019-01-25 NOTE — Progress Notes (Signed)
Patient in today for second shingles vaccine  Vaccine given and patient tolerated well. 

## 2019-03-08 ENCOUNTER — Encounter: Payer: Self-pay | Admitting: Medical

## 2019-03-08 ENCOUNTER — Ambulatory Visit: Payer: Self-pay

## 2019-03-08 ENCOUNTER — Other Ambulatory Visit: Payer: Self-pay

## 2019-03-08 ENCOUNTER — Ambulatory Visit (INDEPENDENT_AMBULATORY_CARE_PROVIDER_SITE_OTHER): Payer: BLUE CROSS/BLUE SHIELD | Admitting: Medical

## 2019-03-08 DIAGNOSIS — W540XXA Bitten by dog, initial encounter: Secondary | ICD-10-CM

## 2019-03-08 DIAGNOSIS — S61251A Open bite of left index finger without damage to nail, initial encounter: Secondary | ICD-10-CM

## 2019-03-08 DIAGNOSIS — S61250A Open bite of right index finger without damage to nail, initial encounter: Secondary | ICD-10-CM | POA: Diagnosis not present

## 2019-03-08 MED ORDER — AMOXICILLIN-POT CLAVULANATE 875-125 MG PO TABS: 1.0000 | ORAL_TABLET | Freq: Two times a day (BID) | ORAL | 0 refills | Status: DC

## 2019-03-08 NOTE — Telephone Encounter (Signed)
Pt. Called to report dog bite that occurred approx. 2:00 PM today.  Reported the dog bite was from her own dog, when she was trying to break up a fight between her 2 dogs.  Reported she has puncture wounds on her left middle, and index fingers, and on the right index finger.  Stated the wounds bled initially, and the bleeding has stopped.  Stated she cleaned the wounds with soap and water, immediately following the incident.  Noted Tetanus shot was given in 11/2011.   Transferred the pt. To the office to have Virtual Visit scheduled.  Pt. Agrees with plan.   Reason for Disposition . [1] Puncture wound or small cut AND [2] on hands or genitals  Answer Assessment - Initial Assessment Questions 1. ANIMAL: "What type of animal caused the bite?" "Is the injury from a bite or a claw?" If the animal is a dog or a cat, ask: "Was it a pet or a stray?" "Was it acting ill or behaving strangely?"     Trying to break up dog fight 2. LOCATION: "Where is the bite located?"      Puncture wounds on left middle and index finger, and right hand on index finger  3. SIZE: "How big is the bite?" "What does it look like?"      Bite marks size of animals teeth  4. ONSET: "When did the bite happen?" (Minutes or hours ago)      About 20 minutes prior to call  5. CIRCUMSTANCES: "Tell me how this happened."      Dogs were fighting and owner tried to break it up 6. TETANUS: "When was the last tetanus booster?"     Within last few years 7. PREGNANCY: "Is there any chance you are pregnant?" "When was your last menstrual period?"     N/a  Protocols used: ANIMAL BITE-A-AH

## 2019-03-08 NOTE — Telephone Encounter (Signed)
Virtual visit scheduled.  

## 2019-03-08 NOTE — Patient Instructions (Addendum)
Dog bite both hands. Superficial and no joint pain or tendon injury. Up to date on tetanus. Will rx augmentin antibiotic. Bite occurred breaking up dog bite(sons dog bit her).  Start augmentin today an prevent infection. Wounds should gradually heal. Any sign or symptom of infection despite [place] of augmentin notify us.  Follow up as needed

## 2019-03-08 NOTE — Progress Notes (Signed)
   Subjective:    Patient ID: Dana Johnston, female    DOB: March 10, 1961, 57 y.o.   MRN: [id number]  HPI Virtual Visit via Video Note  I connected with Dana Johnston on 03/08/19 at  3:20 PM EDT by a video enabled telemedicine application and verified that I am speaking with the correct person using two identifiers.   I discussed the limitations of evaluation and management by telemedicine and the availability of in person appointments. The patient expressed understanding and agreed to proceed.   History of Present Illness:  Pt states got bit by son dachsand. Pt tried to pick up/break up fight between 2 dogs. She has puncture wounds on index fingers. 2 on rt side. One on left side. This happened about one hour ago. Pt had last tetanus in 2013. Staff told her when she called. Pt irrigated and washed her hands.    Observations/Objective: Rt hand- 2nd digit small superficial laceration over dip joint. Asked to look closely and it ti not appear deep. Also small superivical laceration at pip joint. Pt has mallet finger appearance baseline before bite. Arthritic change. Clarified this with pt. Left hand- small superficial laceration only at dip. Same prior deformity as described above before the bite. Pt can bend both finges normaly.   Assessment and Plan: Dog bite both hands. Superficial and no joint pain or tendon injury. Up to date on tetanus. Will rx augmentin antibiotic. Bite occurred breaking up dog bite(sons dog bit her).  Start augmentin today an prevent infection. Wounds should gradually heal. Any sign or symptom of infection despite [place] of augmentin notify us.  Follow up as needed  Follow Up Instructions:    I discussed the assessment and treatment plan with the patient. The patient was provided an opportunity to ask questions and all were answered. The patient agreed with the plan and demonstrated an understanding of the instructions.   The patient was advised to call back or seek an  in-person evaluation if the symptoms worsen or if the condition fails to improve as anticipated.     Esperanza Richters, PA-C    Review of Systems     Objective:   Physical Exam        Assessment & Plan:

## 2019-11-29 ENCOUNTER — Encounter: Payer: Self-pay | Admitting: Family

## 2019-12-27 ENCOUNTER — Ambulatory Visit (INDEPENDENT_AMBULATORY_CARE_PROVIDER_SITE_OTHER): Payer: BC Managed Care – PPO | Admitting: Family

## 2019-12-27 ENCOUNTER — Other Ambulatory Visit: Payer: Self-pay

## 2019-12-27 ENCOUNTER — Encounter: Payer: Self-pay | Admitting: Family

## 2019-12-27 VITALS — BP 135/72 | HR 71 | Temp 96.8°F | Resp 16 | Ht 65.0 in | Wt 164.0 lb

## 2019-12-27 DIAGNOSIS — Z0000 Encounter for general adult medical examination without abnormal findings: Secondary | ICD-10-CM | POA: Diagnosis not present

## 2019-12-27 LAB — CBC
HCT: 42.6 % (ref 36.0–46.0)
Hemoglobin: 14.1 g/dL (ref 12.0–15.0)
MCHC: 33.1 g/dL (ref 30.0–36.0)
MCV: 90.7 fl (ref 78.0–100.0)
Platelets: 302 10*3/uL (ref 150.0–400.0)
RBC: 4.7 Mil/uL (ref 3.87–5.11)
RDW: 12.6 % (ref 11.5–15.5)
WBC: 5.8 10*3/uL (ref 4.0–10.5)

## 2019-12-27 LAB — BASIC METABOLIC PANEL
BUN: 13 mg/dL (ref 6–23)
CO2: 28 mEq/L (ref 19–32)
Calcium: 9.2 mg/dL (ref 8.4–10.5)
Chloride: 105 mEq/L (ref 96–112)
Creatinine, Ser: 0.77 mg/dL (ref 0.40–1.20)
GFR: 77.27 mL/min (ref >60.00)
Glucose, Bld: 90 mg/dL (ref 70–99)
Potassium: 4 mEq/L (ref 3.5–5.1)
Sodium: 141 mEq/L (ref 135–145)

## 2019-12-27 LAB — HEPATIC FUNCTION PANEL
ALT: 12 U/L (ref 0–35)
AST: 15 U/L (ref 0–37)
Albumin: 4.2 g/dL (ref 3.5–5.2)
Alkaline Phosphatase: 103 U/L (ref 39–117)
Bilirubin, Direct: 0.1 mg/dL (ref 0.0–0.3)
Total Bilirubin: 0.4 mg/dL (ref 0.2–1.2)
Total Protein: 6.7 g/dL (ref 6.0–8.3)

## 2019-12-27 LAB — LIPID PANEL
Cholesterol: 145 mg/dL (ref 0–200)
HDL: 45.1 mg/dL (ref >39.00)
LDL Cholesterol: 89 mg/dL (ref 0–99)
NonHDL: 99.59
Total CHOL/HDL Ratio: 3
Triglycerides: 52 mg/dL (ref 0.0–149.0)
VLDL: 10.4 mg/dL (ref 0.0–40.0)

## 2019-12-27 LAB — TSH: TSH: 1.92 u[IU]/mL (ref 0.35–4.50)

## 2019-12-27 NOTE — Progress Notes (Signed)
Subjective:    Patient ID: Dana Johnston, female    DOB: [birth year], 59 y.o.   MRN: [id number]  HPI  Patient presents today for complete physical.  Immunizations:  She had flu shot at work around 08/06/19 Diet:healthy Wt Readings from Last 3 Encounters:  12/27/19 164 lb (74.4 kg)  11/26/18 193 lb (87.5 kg)  04/06/18 190 lb 12.8 oz (86.5 kg)  Exercise: walks 3-4 times a week Colonoscopy: due Dexa:  2019- up to date Pap Smear:  2019 Mammogram: due     Review of Systems  Constitutional: Negative for unexpected weight change.  HENT: Negative for hearing loss and rhinorrhea.   Eyes: Negative for visual disturbance.  Respiratory: Negative for cough.   Cardiovascular: Negative for chest pain.  Gastrointestinal: Negative for constipation, diarrhea and nausea.  Genitourinary: Negative for dysuria and frequency.  Musculoskeletal: Positive for arthralgias (some arthritis in her hands).  Skin: Negative for rash.  Neurological: Negative for headaches.  Hematological: Negative for adenopathy.  Psychiatric/Behavioral:       Denies depression/anxiety   Past Medical History:  Diagnosis Date  . Allergy   . Anxiety   . Arthritis   . Depression   . Hand pain   . Osteoarthritis   . Polyarthralgia   . Rheumatoid arthritis ([place])      Social History   Socioeconomic History  . Marital status: Married    Spouse name: Not on file  . Number of children: Not on file  . Years of education: Not on file  . Highest education level: Not on file  Occupational History  . Not on file  Tobacco Use  . Smoking status: Never Smoker  . Smokeless tobacco: Never Used  Substance and Sexual Activity  . Alcohol use: Yes    Comment: Occasional margarita  . Drug use: No  . Sexual activity: Yes    Partners: Female  Other Topics Concern  . 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 Difficulty of Paying Living Expenses: Not on [place]:   . Worried About Charity fundraiser in the Last Year: Not on file  . Ran Out of Food in the Last Year: Not on file  Transportation Needs:   . Lack of Transportation (Medical): Not on file  . Lack of Transportation (Non-Medical): Not on file  Physical Activity:   . Days of Exercise per Week: Not on file  . Minutes of Exercise per Session: Not on file  Stress:   . Feeling of Stress : Not on file  Social Connections:   . Frequency of Communication with Friends and Family: Not on file  . Frequency of Social Gatherings with Friends and Family: Not on file  . Attends Religious Services: Not on file  . Active Member of Clubs or Organizations: Not on file  . Attends Archivist Meetings: Not on file  . Marital Status: Not on file  Intimate Partner Violence:   . Fear of Current or Ex-Partner: Not on file  .  Emotionally Abused: Not on file  . Physically Abused: Not on file  . Sexually Abused: Not on file    Past Surgical History:  Procedure Laterality Date  . MEDIAL PARTIAL KNEE REPLACEMENT Left 03/2016  . WRIST SURGERY Right 08/27/2018    Family History  Problem Relation Age of Onset  . Heart disease Mother        MI at age 34, smoker   . Arthritis Father        died at 35 in MVA  . Hyperlipidemia Father   . Hypertension Father   . Arthritis Maternal Grandmother   . Heart disease Maternal Grandfather     Allergies  Allergen Reactions  . Other Swelling    nuts  . Sulfa Antibiotics Swelling    No current outpatient medications on file prior to visit.   No current facility-administered medications on file prior to visit.    BP 135/72  (BP Location: Right Arm, Patient Position: Sitting, Cuff Size: Small)   Pulse 71   Temp (!) 96.8 F (36 C) (Oral)   Resp 16   Ht 5\' 5"  (1.651 m)   Wt 164 lb (74.4 kg)   SpO2 100%   BMI 27.29 kg/m       Objective:   Physical Exam  Physical Exam  Constitutional: She is oriented to person, place, and time. She appears well-developed and well-nourished. No distress.  HENT:  Head: Normocephalic and atraumatic.  Right Ear: Tympanic membrane and ear canal normal.  Left Ear: Tympanic membrane and ear canal normal.  Mouth/Throat: Oropharynx is clear and moist.  Eyes: Pupils are equal, round, and reactive to light. No scleral icterus.  Neck: Normal range of motion. No thyromegaly present.  Cardiovascular: Normal rate and regular rhythm.   No murmur heard. Pulmonary/Chest: Effort normal and breath sounds normal. No respiratory distress. He has no wheezes. She has no rales. She exhibits no tenderness.  Abdominal: Soft. Bowel sounds are normal. She exhibits no distension and no mass. There is no tenderness. There is no rebound and no guarding.  Musculoskeletal: She exhibits no edema.  Lymphadenopathy:    She has no cervical adenopathy.  Neurological: She is alert and oriented to person, place, and time. She has normal patellar reflexes. She exhibits normal muscle tone. Coordination normal.  Skin: Skin is warm and dry.  Psychiatric: She has a normal mood and affect. Her behavior is normal. Judgment and thought content normal.  Breasts: Examined lying Right: Without masses, retractions, discharge or axillary adenopathy.  Left: Without masses, retractions, discharge or axillary adenopathy.  Pelvic: deferred         Assessment & Plan:   Preventative care- commended pt on her exercise/weight loss. Pap up to date.  Immunizations up to date. Refer for mammo/colo     Assessment & Plan:

## 2019-12-27 NOTE — Patient Instructions (Signed)

## 2019-12-30 ENCOUNTER — Ambulatory Visit (HOSPITAL_BASED_OUTPATIENT_CLINIC_OR_DEPARTMENT_OTHER): Payer: BC Managed Care – PPO

## 2020-01-17 ENCOUNTER — Other Ambulatory Visit: Payer: Self-pay

## 2020-01-17 ENCOUNTER — Ambulatory Visit (HOSPITAL_BASED_OUTPATIENT_CLINIC_OR_DEPARTMENT_OTHER)
Admission: RE | Admit: 2020-01-17 | Discharge: 2020-01-17 | Disposition: A | Discharge status: Home / Self Care | Payer: BC Managed Care – PPO | Source: Ambulatory Visit | Attending: Family | Admitting: Family

## 2020-01-17 DIAGNOSIS — Z1231 Encounter for screening mammogram for malignant neoplasm of breast: Secondary | ICD-10-CM | POA: Insufficient documentation

## 2020-01-17 DIAGNOSIS — Z0000 Encounter for general adult medical examination without abnormal findings: Secondary | ICD-10-CM

## 2020-02-24 ENCOUNTER — Encounter: Payer: Self-pay | Admitting: Family

## 2020-03-27 ENCOUNTER — Encounter: Payer: Self-pay | Admitting: Family

## 2020-03-27 ENCOUNTER — Other Ambulatory Visit: Payer: Self-pay

## 2020-03-27 ENCOUNTER — Ambulatory Visit: Payer: BC Managed Care – PPO | Admitting: Family

## 2020-03-27 ENCOUNTER — Ambulatory Visit (HOSPITAL_BASED_OUTPATIENT_CLINIC_OR_DEPARTMENT_OTHER)
Admission: RE | Admit: 2020-03-27 | Discharge: 2020-03-27 | Disposition: A | Discharge status: Home / Self Care | Payer: BC Managed Care – PPO | Source: Ambulatory Visit | Attending: Family | Admitting: Family

## 2020-03-27 VITALS — BP 139/65 | HR 64 | Temp 97.2°F | Resp 16 | Wt 160.0 lb

## 2020-03-27 DIAGNOSIS — R0602 Shortness of breath: Secondary | ICD-10-CM

## 2020-03-27 DIAGNOSIS — R5383 Other fatigue: Secondary | ICD-10-CM

## 2020-03-27 LAB — CBC WITH DIFFERENTIAL/PLATELET
Basophils Absolute: 0.1 10*3/uL (ref 0.0–0.1)
Basophils Relative: 1 % (ref 0.0–3.0)
Eosinophils Absolute: 0.4 10*3/uL (ref 0.0–0.7)
Eosinophils Relative: 7.3 % — ABNORMAL HIGH (ref 0.0–5.0)
HCT: 43.8 % (ref 36.0–46.0)
Hemoglobin: 14.6 g/dL (ref 12.0–15.0)
Lymphocytes Relative: 31.7 % (ref 12.0–46.0)
Lymphs Abs: 1.6 10*3/uL (ref 0.7–4.0)
MCHC: 33.3 g/dL (ref 30.0–36.0)
MCV: 91.3 fl (ref 78.0–100.0)
Monocytes Absolute: 0.4 10*3/uL (ref 0.1–1.0)
Monocytes Relative: 7.6 % (ref 3.0–12.0)
Neutro Abs: 2.7 10*3/uL (ref 1.4–7.7)
Neutrophils Relative %: 52.4 % (ref 43.0–77.0)
Platelets: 255 10*3/uL (ref 150.0–400.0)
RBC: 4.79 Mil/uL (ref 3.87–5.11)
RDW: 12.5 % (ref 11.5–15.5)
WBC: 5.1 10*3/uL (ref 4.0–10.5)

## 2020-03-27 LAB — COMPREHENSIVE METABOLIC PANEL
ALT: 10 U/L (ref 0–35)
AST: 14 U/L (ref 0–37)
Albumin: 4.2 g/dL (ref 3.5–5.2)
Alkaline Phosphatase: 103 U/L (ref 39–117)
BUN: 15 mg/dL (ref 6–23)
CO2: 32 mEq/L (ref 19–32)
Calcium: 9.2 mg/dL (ref 8.4–10.5)
Chloride: 106 mEq/L (ref 96–112)
Creatinine, Ser: 0.83 mg/dL (ref 0.40–1.20)
GFR: 70.79 mL/min (ref >60.00)
Glucose, Bld: 66 mg/dL — ABNORMAL LOW (ref 70–99)
Potassium: 3.9 mEq/L (ref 3.5–5.1)
Sodium: 141 mEq/L (ref 135–145)
Total Bilirubin: 0.5 mg/dL (ref 0.2–1.2)
Total Protein: 6.8 g/dL (ref 6.0–8.3)

## 2020-03-27 LAB — TSH: TSH: 2.23 u[IU]/mL (ref 0.35–4.50)

## 2020-03-27 NOTE — Progress Notes (Signed)
Subjective:    Patient ID: Dana Johnston, female    DOB: [birth year], 59 y.o.   MRN: [id number]  HPI  Patient is a 59 yr old female today who presents with report of SOB that occurred last weekend after she completed a 2 mile walk. She also reports associated fatigue. She denies CP, LE edema.  Denies palpitations.  Denies blood in stools. Denies black/tarry stools. Denies calf pain or swelling. Denies recent long travel trips.   Review of Systems See HPI  Past Medical History:  Diagnosis Date  . Allergy   . Anxiety   . Arthritis   . Depression   . Hand pain   . Osteoarthritis   . Polyarthralgia   . Rheumatoid arthritis ([facility])      Social History   Socioeconomic History  . Marital status: Married    Spouse name: Not on file  . Number of children: Not on file  . Years of education: Not on file  . Highest education level: Not on file  Occupational History  . Not on file  Tobacco Use  . Smoking status: Never Smoker  . Smokeless tobacco: Never Used  Substance and Sexual Activity  . Alcohol use: Yes    Comment: Occasional margarita  . Drug use: No  . Sexual activity: Yes    Partners: Female  Other Topics Concern  . 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 Difficulty of Paying Living Expenses:   Food Insecurity:   . Worried About Programme researcher, broadcasting/film/video in the Last Year:   . Barista in the Last Year:   Transportation Needs:   . Freight forwarder (Medical):   Marland Kitchen Lack of Transportation (Non-Medical):   Physical Activity:   . Days of Exercise per Week:   . Minutes of Exercise per Session:     Stress:   . Feeling of Stress :   Social Connections:   . Frequency of Communication with Friends and Family:   . Frequency of Social Gatherings with Friends and Family:   . Attends Religious Services:   . Active Member of Clubs or Organizations:   . Attends Banker Meetings:   Marland Kitchen Marital Status:   Intimate Partner Violence:   . Fear of Current or Ex-Partner:   . Emotionally Abused:   Marland Kitchen Physically Abused:   . Sexually Abused:     Past Surgical History:  Procedure Laterality Date  . MEDIAL PARTIAL KNEE REPLACEMENT Left 03/2016  . WRIST SURGERY Right 08/27/2018    Family History  Problem Relation Age of Onset  . Heart disease Mother        MI at age 26, smoker   . Arthritis Father        died at 15 in MVA  . Hyperlipidemia Father   . Hypertension Father   . Arthritis Maternal Grandmother   . Heart disease Maternal Grandfather     Allergies  Allergen Reactions  . Other Swelling    nuts  .  Sulfa Antibiotics Swelling    No current outpatient medications on file prior to visit.   No current facility-administered medications on file prior to visit.    BP 139/65 (BP Location: Right Arm, Patient Position: Sitting, Cuff Size: Small)   Pulse 64   Temp (!) 97.2 F (36.2 C) (Temporal)   Resp 16   Wt 160 lb (72.6 kg)   SpO2 100%   BMI 26.63 kg/m       Objective:   Physical Exam Constitutional:      Appearance: She is well-developed.  Eyes:     Comments: Pale conjunctiva  Cardiovascular:     Rate and Rhythm: Normal rate and regular rhythm.     Heart sounds: Normal heart sounds. No murmur.  Pulmonary:     Effort: Pulmonary effort is normal. No respiratory distress.     Breath sounds: Normal breath sounds. No wheezing.  Musculoskeletal:     Cervical back: Neck supple.     Right lower leg: No edema.     Left lower leg: No edema.  Skin:    General: Skin is warm and dry.  Neurological:     Mental Status: She is alert and oriented to person,  place, and time.  Psychiatric:        Behavior: Behavior normal.        Thought Content: Thought content normal.        Judgment: Judgment normal.           Assessment & Plan:  SOB/Fatigue- will obtain cmet, CBC, TSH.  She is low risk for PE. Oxygen saturation is 100%.  EKG today. EKG tracing is personally reviewed.  EKG notes NSR.  No acute changes.  CXR today. She is advised to go to the ER if recurrent sob or if she develops chest pain.    Advised of CDC guidelines for self isolation/ ending isolation.  Advised of safe practice guidelines. Symptom Tier reviewed.  Encouraged to monitor for any worsening symptoms; watch for increased shortness of breath, weakness, and signs of dehydration. Advised when to seek emergency care.  Instructed to rest and hydrate well.  Advised to leave the house during recommended isolation period, only if it is necessary to seek medical care

## 2020-03-27 NOTE — Addendum Note (Signed)
Addended by: Sandford Craze on: 03/27/2020 09:10 AM   Modules accepted: Orders

## 2020-03-27 NOTE — Patient Instructions (Signed)
Please complete lab work prior to leaving. Complete chest x-ray on the first floor.  Go to ER if you develop recurrent shortness of breath or if you develop chest pain.

## 2020-04-29 ENCOUNTER — Ambulatory Visit: Payer: BC Managed Care – PPO | Admitting: Family

## 2020-08-11 IMAGING — MG DIGITAL SCREENING BILAT W/ TOMO W/ CAD
8 series · 8 of 24 positions shown · non-contrast
Comparison: Previous exam(s).

ACR Breast Density Category a: The breast tissue is almost entirely
fatty.

CLINICAL DATA: Screening.

EXAM:
DIGITAL SCREENING BILATERAL MAMMOGRAM WITH TOMO AND CAD

[R MLO synth-2D]
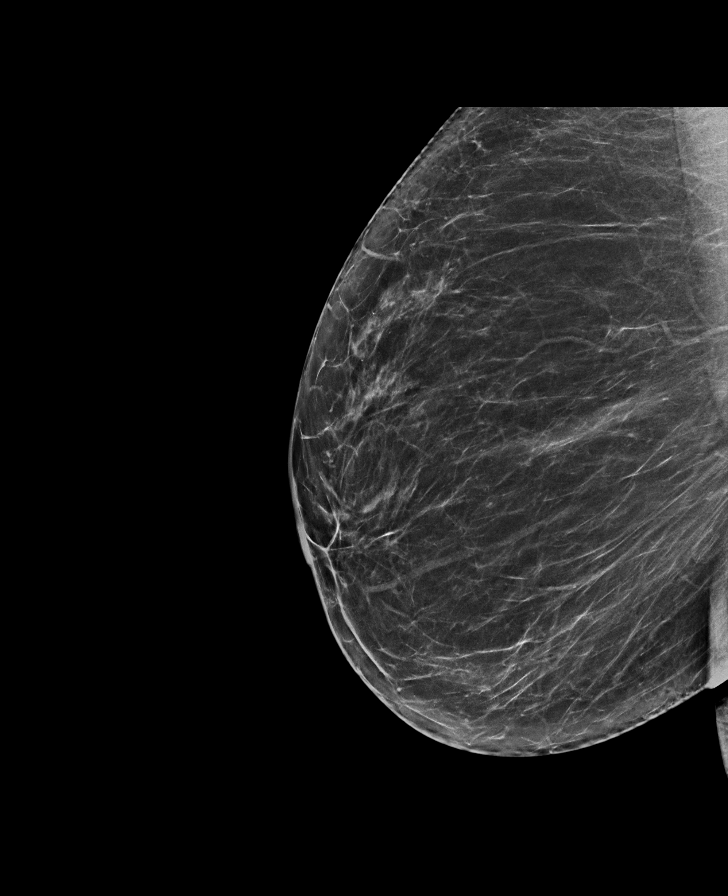

[L CC synth-2D]
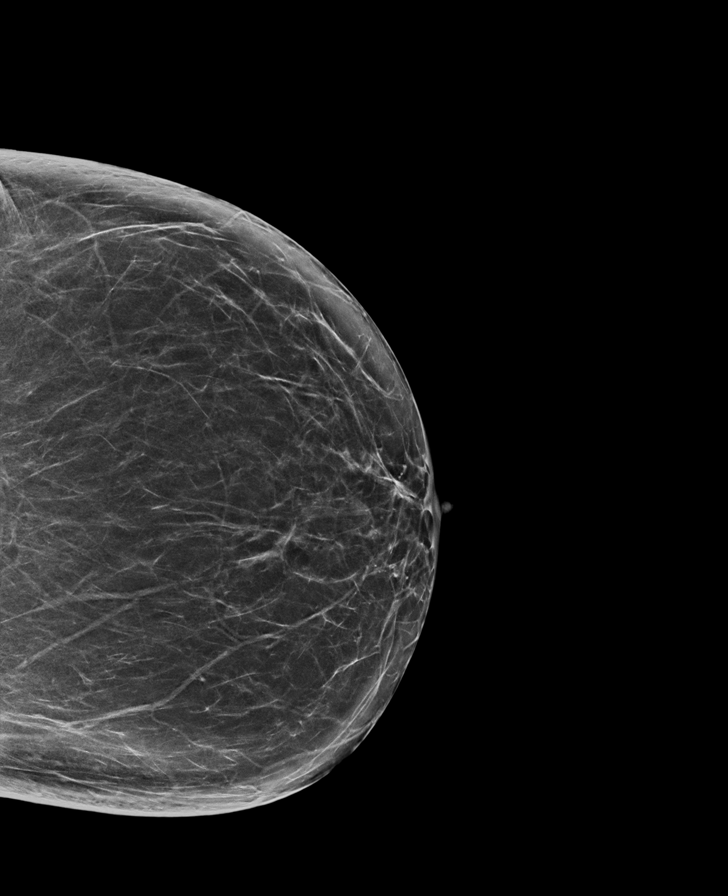

[R CC synth-2D]
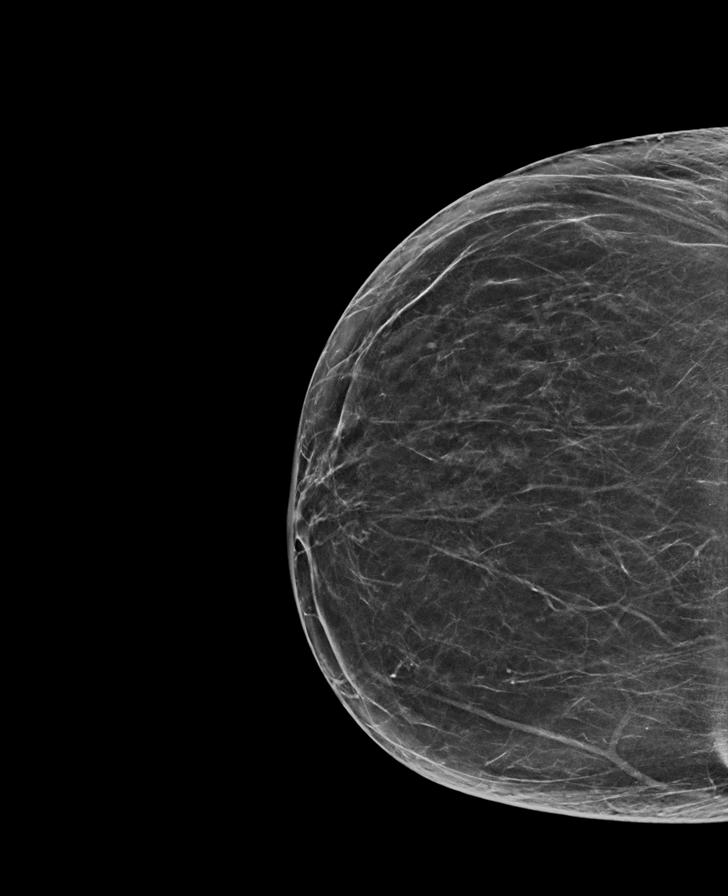

[L MLO synth-2D]
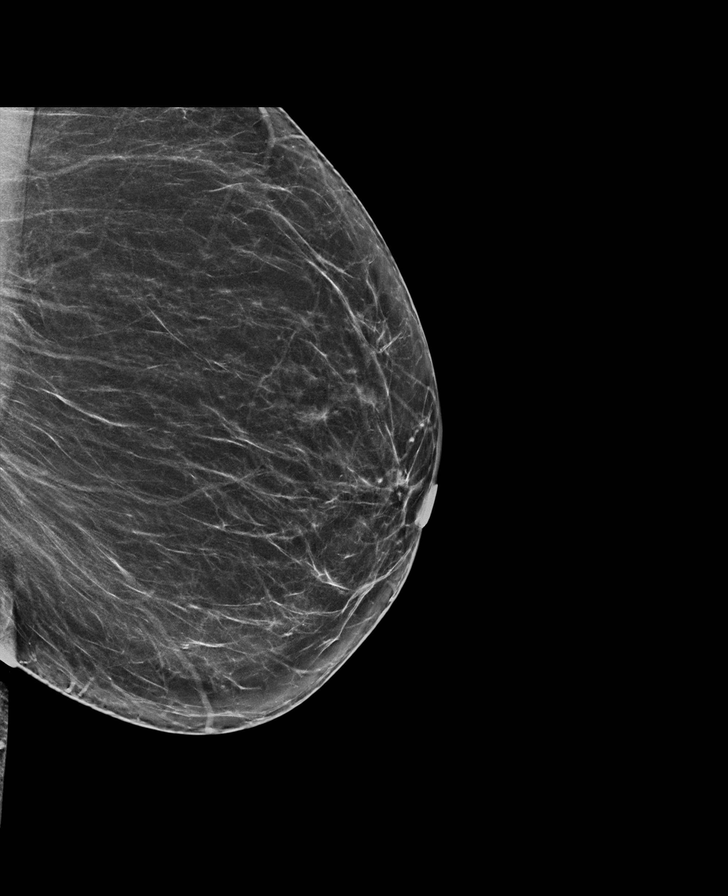

[L CC tomo · tomo slice 35/70.0]
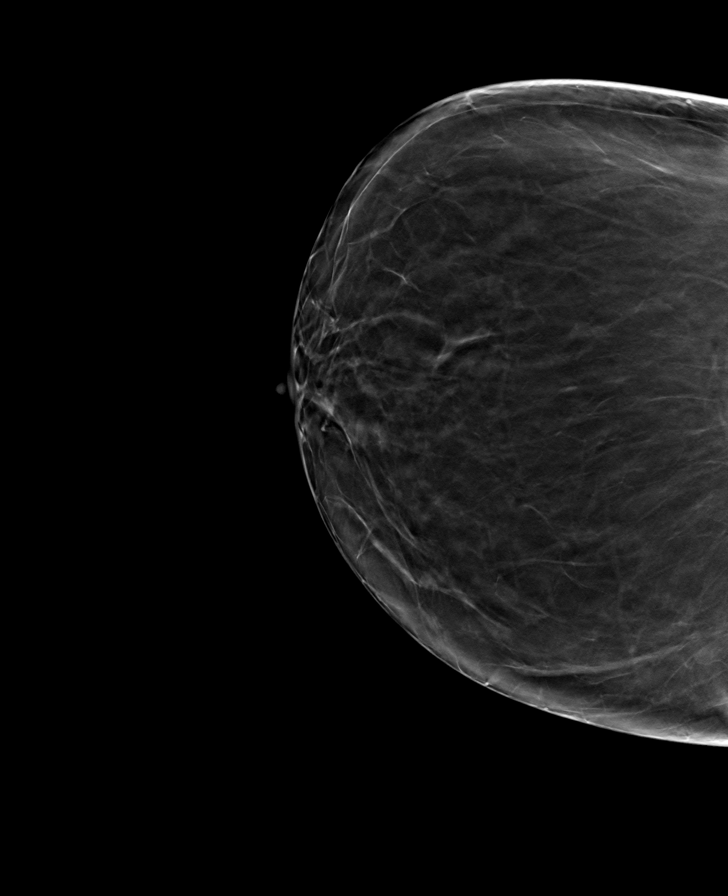

[R MLO tomo · tomo slice 36/71.0]
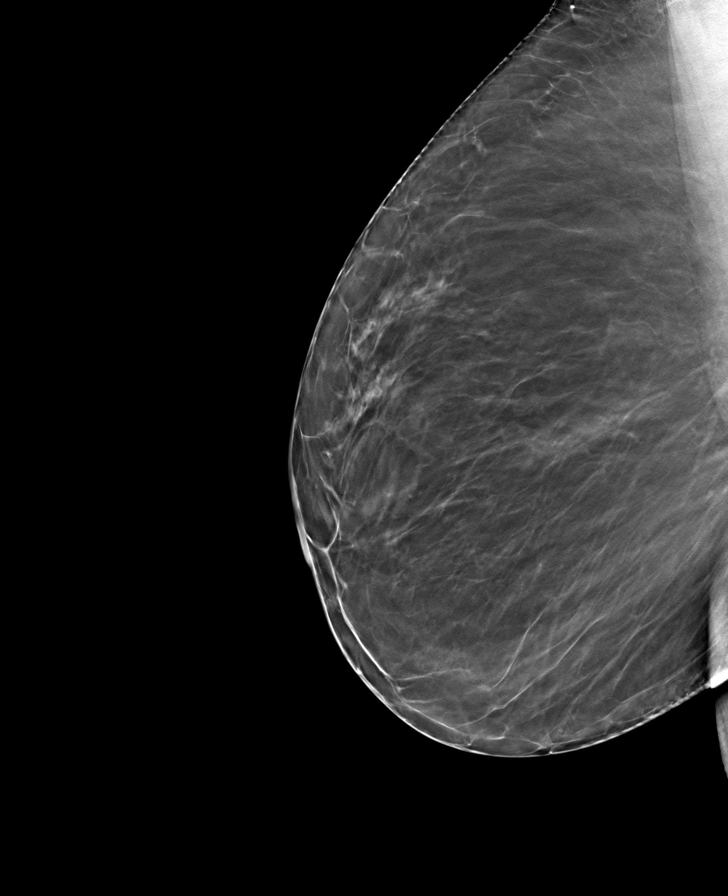

[L MLO tomo · tomo slice 37/72.0]
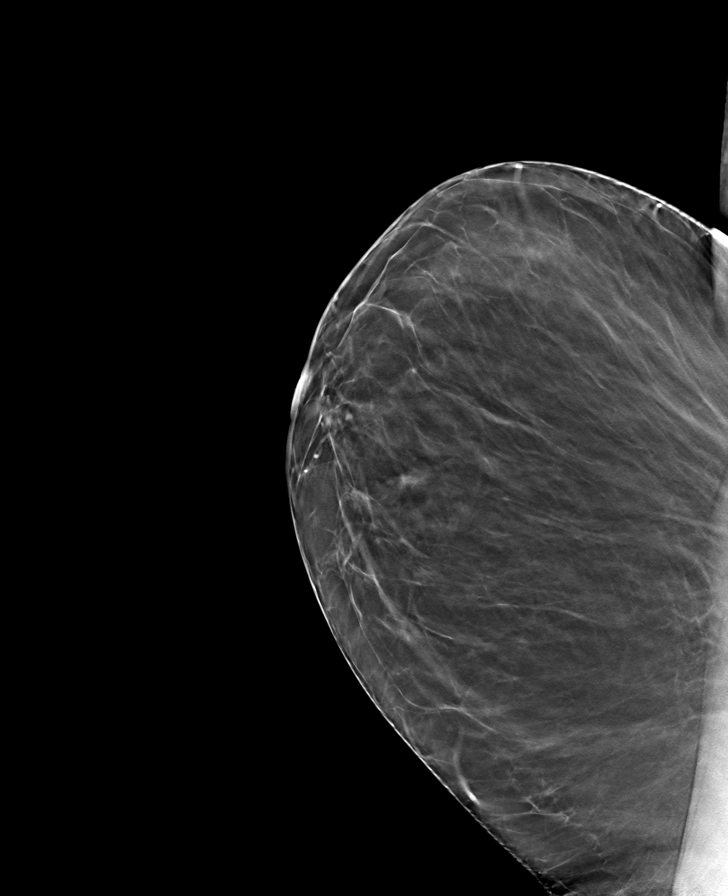

[R CC tomo · tomo slice 33/64.0]
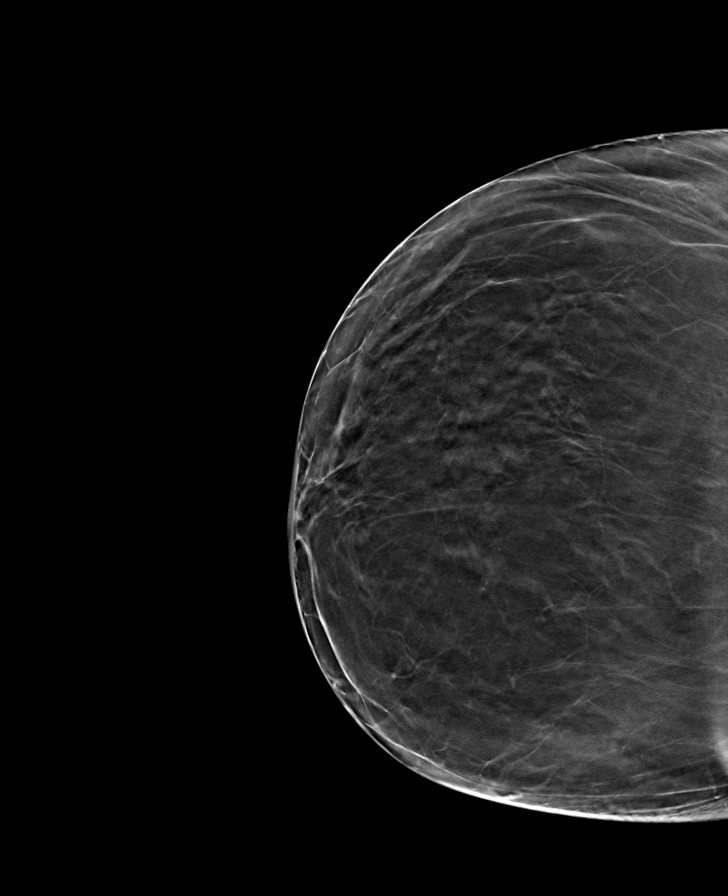

[8 of 24 positions shown; findings below may reference images not displayed]

FINDINGS: There are no findings suspicious for malignancy. Images were
processed with CAD.
IMPRESSION: No mammographic evidence of malignancy. A result letter of this
screening mammogram will be mailed directly to the patient.

RECOMMENDATION:
Screening mammogram in one year. (Code:[id number]-[id number])

BI-RADS CATEGORY  1: Negative.

## 2021-01-01 ENCOUNTER — Encounter: Payer: BC Managed Care – PPO | Admitting: Family

## 2021-01-04 ENCOUNTER — Encounter: Payer: Self-pay | Admitting: Family

## 2021-01-04 ENCOUNTER — Ambulatory Visit (INDEPENDENT_AMBULATORY_CARE_PROVIDER_SITE_OTHER): Payer: BC Managed Care – PPO | Admitting: Family

## 2021-01-04 ENCOUNTER — Other Ambulatory Visit: Payer: Self-pay

## 2021-01-04 ENCOUNTER — Other Ambulatory Visit (HOSPITAL_COMMUNITY)
Admission: RE | Admit: 2021-01-04 | Discharge: 2021-01-04 | Disposition: A | Discharge status: Home / Self Care | Payer: BC Managed Care – PPO | Source: Ambulatory Visit | Attending: Family | Admitting: Family

## 2021-01-04 VITALS — BP 126/63 | HR 65 | Temp 97.6°F | Resp 16 | Ht 65.0 in | Wt 172.0 lb

## 2021-01-04 DIAGNOSIS — Z0000 Encounter for general adult medical examination without abnormal findings: Secondary | ICD-10-CM | POA: Diagnosis not present

## 2021-01-04 DIAGNOSIS — R739 Hyperglycemia, unspecified: Secondary | ICD-10-CM

## 2021-01-04 DIAGNOSIS — E781 Pure hyperglyceridemia: Secondary | ICD-10-CM

## 2021-01-04 DIAGNOSIS — K529 Noninfective gastroenteritis and colitis, unspecified: Secondary | ICD-10-CM

## 2021-01-04 MED ORDER — FLUOXETINE HCL 20 MG PO TABS: 30.0000 mg | ORAL_TABLET | Freq: Every day | ORAL | 3 refills | Status: DC

## 2021-01-04 NOTE — Patient Instructions (Addendum)
Please complete lab work prior to leaving. You should be contacted about scheduling your colonoscopy.  Continue healthy diet and regular execise.

## 2021-01-04 NOTE — Progress Notes (Signed)
Subjective:    Patient ID: Dana Johnston, female    DOB: 1961/03/10, 60 y.o.   MRN: [id number]  HPI  Patient presents today for complete physical.  Immunizations: tdap up to date, shingrix x2 complete.  Had 2 modernas, due for booster and flu shot Diet:  Reports that she has been trying to work on weight loss. Exercise: enjoys rowing machine Wt Readings from Last 3 Encounters:  01/04/21 172 lb (78 kg)  03/27/20 160 lb (72.6 kg)  12/27/19 164 lb (74.4 kg)  Colonoscopy: due Dexa: 2019- normal. Pap Smear: due- will do today Mammogram: due 2/26  Vision: 3/22 Dental: 11/21  Last visit she noted sob/fatigue.  Lab work was unrevealing. CXR was clear. EKG noted sinus brady without acute changes.  Echo was ordered, however pt did not complete the echo. She reports that this has resolved.  [facility]. They are prescribing her fluoxetine nad she reports that her mood is stable.   She follows with Dr. Dion Saucier for knee pain- uses meloxicam prn.   Review of Systems  Constitutional: Negative for unexpected weight change.  HENT: Negative for facial swelling and hearing loss.   Eyes: Negative for visual disturbance.  Respiratory: Negative for cough and shortness of breath.   Cardiovascular: Negative for chest pain.  Gastrointestinal: Negative for blood in stool, constipation and diarrhea.  Genitourinary: Negative for dysuria, frequency and hematuria.  Musculoskeletal: Positive for arthralgias (right knee pain).  Skin: Negative for rash.  Neurological: Negative for headaches.  Hematological: Negative for adenopathy.  Psychiatric/Behavioral:       Reports mood is good on fluoxetine.    Past Medical History:  Diagnosis Date  . Allergy   . Anxiety   . Arthritis   . Depression   . Hand pain   . Osteoarthritis   . Polyarthralgia   . Rheumatoid arthritis (H[facility])      Social History   Socioeconomic History  . Marital status: Married    Spouse name:  Not on file  . Number of children: Not on file  . Years of education: Not on file  . Highest education level: Not on file  Occupational History  . Not on file  Tobacco Use  . Smoking status: Never Smoker  . Smokeless tobacco: Never Used  Vaping Use  . Vaping Use: Never used  Substance and Sexual Activity  . Alcohol use: Yes    Comment: Occasional margarita  . Drug use: No  . Sexual activity: Yes    Partners: Female  Other Topics Concern  . 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 MEDIAL PARTIAL KNEE REPLACEMENT Left 03/2016  . WRIST SURGERY Right 08/27/2018    Family History  Problem  Relation Age of Onset  . Heart disease Mother        MI at age 87, smoker   . Arthritis Father        died at 49 in MVA  . Hyperlipidemia Father   . Hypertension Father   . Arthritis Maternal Grandmother   . Heart disease Maternal Grandfather     Allergies  Allergen Reactions  . Other Swelling    nuts  . Sulfa Antibiotics Swelling    Current Outpatient Medications on File Prior to Visit  Medication Sig Dispense Refill  . meloxicam (MOBIC) 15 MG tablet TAKE 1 TABLET BY MOUTH ONCE A DAY WITH FOOD FOR INFLAMMATION     No current facility-administered medications on file prior to visit.    BP 126/63 (BP Location: Right Arm, Patient Position: Sitting, Cuff  Size: Normal)   Pulse 65   Temp 97.6 F (36.4 C) (Oral)   Resp 16   Ht 5\' 5"  (1.651 m)   Wt 172 lb (78 kg)   SpO2 100%   BMI 28.62 kg/m         Objective:   Physical Exam Exam conducted with a chaperone present.   Physical Exam  Constitutional: She is oriented to person, place, and time. She appears well-developed and well-nourished. No distress.  HENT:  Head: Normocephalic and atraumatic.  Right Ear: Tympanic membrane and ear canal normal.  Left Ear: Tympanic membrane and ear canal normal.  Mouth/Throat: not examined- pt wearing mask Eyes: Pupils are equal, round, and reactive to light. No scleral icterus.  Neck: Normal range of motion. No thyromegaly present.  Cardiovascular: Normal rate and regular rhythm.   No murmur heard. Pulmonary/Chest: Effort normal and breath sounds normal. No respiratory distress. He has no wheezes. She has no rales. She exhibits no tenderness.  Abdominal: Soft. Bowel sounds are normal. She exhibits no distension and no mass. There is no tenderness. There is no rebound and no guarding.  Musculoskeletal: She exhibits no edema.  Lymphadenopathy:    She has no cervical adenopathy.  Neurological: She is alert and oriented to person, place, and time. She has normal patellar reflexes. She exhibits normal muscle tone. Coordination normal.  Skin: Skin is warm and dry.  Psychiatric: She has a normal mood and affect. Her behavior is normal. Judgment and thought content normal.  Breasts: Examined lying Right: Without masses, retractions, discharge or axillary adenopathy.  Left: Without masses, retractions, discharge or axillary adenopathy.  Inguinal/mons: Normal without inguinal adenopathy  External genitalia: Normal  BUS/Urethra/Skene's glands: Normal  Bladder: Normal  Vagina: Normal  Cervix: Normal  Uterus: normal in size, shape and contour. Midline and mobile  Adnexa/parametria:  Rt: Without masses or tenderness.  Lt: Without masses or tenderness.   Anus and perineum: Normal            Assessment & Plan:  Preventative care- discussed healthy diet, exercise and weight loss.  She will be due for mammogram later this month. Order placed.  She is also overdue for colonoscopy and is willing to complete colonoscopy.  dexa up to date. Pap performed today.  Labs as ordered.  Immunizations reviewed and up to date.  This visit occurred during the SARS-CoV-2 public health emergency.  Safety protocols were in place, including screening questions prior to the visit, additional usage of staff PPE, and extensive cleaning of exam room while observing appropriate contact time as indicated for disinfecting solutions.           Assessment &  Plan:

## 2021-01-05 ENCOUNTER — Encounter: Payer: Self-pay | Admitting: Gastroenterology

## 2021-01-07 LAB — CYTOLOGY - PAP
Comment: NEGATIVE
Diagnosis: NEGATIVE
High risk HPV: NEGATIVE

## 2021-01-12 ENCOUNTER — Ambulatory Visit: Payer: BC Managed Care – PPO | Attending: Internal Medicine

## 2021-01-12 ENCOUNTER — Other Ambulatory Visit (HOSPITAL_COMMUNITY): Payer: Self-pay | Admitting: Internal Medicine

## 2021-01-12 DIAGNOSIS — Z23 Encounter for immunization: Secondary | ICD-10-CM

## 2021-01-12 MED FILL — MODERNA COVID-19 VACCINE 10: 100 | 28 days supply | Qty: 0 | Fill #0

## 2021-01-12 NOTE — Progress Notes (Signed)
   Covid-19 Vaccination Clinic  Name:  Ferris Tally    MRN: [id number] DOB: Dec 01, 1960  01/12/2021  Ms. Karan was observed post Covid-19 immunization for 15 minutes without incident. She was provided with Vaccine Information Sheet and instruction to access the V-Safe system.   Ms. Gatchalian was instructed to call 911 with any severe reactions post vaccine: Marland Kitchen Difficulty breathing  . Swelling of face and throat  . A fast heartbeat  . A bad rash all over body  . Dizziness and weakness   Immunizations Administered    Name Date Dose VIS Date Route   Moderna Covid-19 Booster Vaccine 01/12/2021  2:37 PM 0.25 mL 09/09/2020 Intramuscular   Manufacturer: Moderna   Lot: [id number]   NDC: 63846-659-93

## 2021-01-18 ENCOUNTER — Ambulatory Visit (HOSPITAL_BASED_OUTPATIENT_CLINIC_OR_DEPARTMENT_OTHER): Payer: BC Managed Care – PPO

## 2021-02-12 ENCOUNTER — Ambulatory Visit (AMBULATORY_SURGERY_CENTER): Payer: Self-pay

## 2021-02-12 ENCOUNTER — Other Ambulatory Visit: Payer: Self-pay

## 2021-02-12 VITALS — Ht 65.0 in | Wt 174.4 lb

## 2021-02-12 DIAGNOSIS — Z1211 Encounter for screening for malignant neoplasm of colon: Secondary | ICD-10-CM

## 2021-02-12 NOTE — Progress Notes (Signed)
No allergies to soy or egg Pt is not on blood thinners or diet pills Denies issues with sedation/intubation Denies atrial flutter/fib Denies constipation   Pt is aware of Covid safety and care partner requirements.      

## 2021-02-24 ENCOUNTER — Encounter: Payer: Self-pay | Admitting: Gastroenterology

## 2021-02-25 ENCOUNTER — Encounter (HOSPITAL_BASED_OUTPATIENT_CLINIC_OR_DEPARTMENT_OTHER): Payer: Self-pay

## 2021-02-25 ENCOUNTER — Ambulatory Visit (HOSPITAL_BASED_OUTPATIENT_CLINIC_OR_DEPARTMENT_OTHER)
Admission: RE | Admit: 2021-02-25 | Discharge: 2021-02-25 | Disposition: A | Discharge status: Home / Self Care | Payer: BC Managed Care – PPO | Source: Ambulatory Visit | Attending: Family | Admitting: Family

## 2021-02-25 ENCOUNTER — Other Ambulatory Visit: Payer: Self-pay

## 2021-02-25 DIAGNOSIS — Z1231 Encounter for screening mammogram for malignant neoplasm of breast: Secondary | ICD-10-CM | POA: Insufficient documentation

## 2021-02-25 DIAGNOSIS — Z0000 Encounter for general adult medical examination without abnormal findings: Secondary | ICD-10-CM

## 2021-02-26 ENCOUNTER — Encounter: Payer: Self-pay | Admitting: Gastroenterology

## 2021-02-26 ENCOUNTER — Ambulatory Visit (AMBULATORY_SURGERY_CENTER): Payer: BC Managed Care – PPO | Admitting: Gastroenterology

## 2021-02-26 VITALS — BP 139/53 | HR 60 | Temp 97.3°F | Resp 18 | Ht 65.0 in | Wt 174.4 lb

## 2021-02-26 DIAGNOSIS — Z1211 Encounter for screening for malignant neoplasm of colon: Secondary | ICD-10-CM | POA: Diagnosis present

## 2021-02-26 DIAGNOSIS — K640 First degree hemorrhoids: Secondary | ICD-10-CM

## 2021-02-26 MED ORDER — SODIUM CHLORIDE 0.9 % IV SOLN: 500.0000 mL | Freq: Once | INTRAVENOUS | Status: DC

## 2021-02-26 NOTE — Progress Notes (Signed)
PT taken to PACU. Monitors in place. VSS. Report given to RN. 

## 2021-02-26 NOTE — Op Note (Signed)
[facility] Patient Name: Dana Johnston Procedure Date: 02/26/2021 9:19 AM MRN: [id number] Endoscopist: Doristine Locks , MD Age: 60 Referring MD:  Date of Birth: [birth year] Gender: Female Account #: [id number] Procedure:                Colonoscopy Indications:              Screening for colorectal malignant neoplasm, This                            is the patient's first colonoscopy Medicines:                Monitored Anesthesia Care Procedure:                Pre-Anesthesia Assessment:                           - Prior to the procedure, a History and Physical                            was performed, and patient medications and                            allergies were reviewed. The patient's tolerance of                            previous anesthesia was also reviewed. The risks                            and benefits of the procedure and the sedation                            options and risks were discussed with the patient.                            All questions were answered, and informed consent                            was obtained. Prior Anticoagulants: The patient has                            taken no previous anticoagulant or antiplatelet                            agents. ASA Grade Assessment: II - A patient with                            mild systemic disease. After reviewing the risks                            and benefits, the patient was deemed in                            satisfactory condition to undergo the procedure.  After obtaining informed consent, the colonoscope                            was passed under direct vision. Throughout the                            procedure, the patient's blood pressure, pulse, and                            oxygen saturations were monitored continuously. The                            Colonoscope was introduced through the anus and                            advanced to the the cecum,  identified by                            appendiceal orifice and ileocecal valve. The                            colonoscopy was performed without difficulty. The                            patient tolerated the procedure well. The quality                            of the bowel preparation was good. The ileocecal                            valve, appendiceal orifice, and rectum were                            photographed. Scope In: 9:27:16 AM Scope Out: 9:44:49 AM Scope Withdrawal Time: 0 hours 11 minutes 17 seconds  Total Procedure Duration: 0 hours 17 minutes 33 seconds  Findings:                 The perianal and digital rectal examinations were                            normal.                           The colon (entire examined portion) appeared normal.                           Non-bleeding internal hemorrhoids were found during                            retroflexion. The hemorrhoids were small and Grade                            I (internal hemorrhoids that do not prolapse). Complications:            No immediate complications.  Estimated Blood Loss:     Estimated blood loss: none. Impression:               - The entire examined colon is normal.                           - Non-bleeding internal hemorrhoids.                           - No specimens collected. Recommendation:           - Patient has a contact number available for                            emergencies. The signs and symptoms of potential                            delayed complications were discussed with the                            patient. Return to normal activities tomorrow.                            Written discharge instructions were provided to the                            patient.                           - Resume previous diet.                           - Continue present medications.                           - Repeat colonoscopy in 10 years for screening                            purposes.                            - Return to GI clinic PRN.                           - Use fiber, for example Citrucel, Fibercon, Konsyl                            or Metamucil. Doristine Locks, MD 02/26/2021 9:50:50 AM

## 2021-02-26 NOTE — Patient Instructions (Signed)
YOU HAD AN ENDOSCOPIC PROCEDURE TODAY AT THE [facility]:   Refer to the procedure report that was given to you for any specific questions about what was found during the examination.  If the procedure report does not answer your questions, please call your gastroenterologist to clarify.  If you requested that your care partner not be given the details of your procedure findings, then the procedure report has been included in a sealed envelope for you to review at your convenience later.  YOU SHOULD EXPECT: Some feelings of bloating in the abdomen. Passage of more gas than usual.  Walking can help get rid of the air that was put into your GI tract during the procedure and reduce the bloating. If you had a lower endoscopy (such as a colonoscopy or flexible sigmoidoscopy) you may notice spotting of blood in your stool or on the toilet paper. If you underwent a bowel prep for your procedure, you may not have a normal bowel movement for a few days.  Please Note:  You might notice some irritation and congestion in your nose or some drainage.  This is from the oxygen used during your procedure.  There is no need for concern and it should clear up in a day or so.  SYMPTOMS TO REPORT IMMEDIATELY:   Following lower endoscopy (colonoscopy or flexible sigmoidoscopy):  Excessive amounts of blood in the stool  Significant tenderness or worsening of abdominal pains  Swelling of the abdomen that is new, acute  Fever of 100F or higher  For urgent or emergent issues, a gastroenterologist can be reached at any hour by calling [phone]. Do not use MyChart messaging for urgent concerns.    DIET:  We do recommend a small meal at first, but then you may proceed to your regular diet.  Drink plenty of fluids but you should avoid alcoholic beverages for 24 hours.  ACTIVITY:  You should plan to take it easy for the rest of today and you should NOT DRIVE or use heavy machinery until tomorrow (because  of the sedation medicines used during the test).    FOLLOW UP: Our staff will call the number listed on your records 48-72 hours following your procedure to check on you and address any questions or concerns that you may have regarding the information given to you following your procedure. If we do not reach you, we will leave a message.  We will attempt to reach you two times.  During this call, we will ask if you have developed any symptoms of COVID 19. If you develop any symptoms (ie: fever, flu-like symptoms, shortness of breath, cough etc.) before then, please call [phone].  If you test positive for Covid 19 in the 2 weeks post procedure, please call and report this information to us.    If any biopsies were taken you will be contacted by phone or by letter within the next 1-3 weeks.  Please call us at [phone] if you have not heard about the biopsies in 3 weeks.    SIGNATURES/CONFIDENTIALITY: You and/or your care partner have signed paperwork which will be entered into your electronic medical record.  These signatures attest to the fact that that the information above on your After Visit Summary has been reviewed and is understood.  Full responsibility of the confidentiality of this discharge information lies with you and/or your care-partner. 

## 2021-03-02 ENCOUNTER — Telehealth: Payer: Self-pay

## 2021-03-02 ENCOUNTER — Telehealth: Payer: Self-pay | Admitting: Family

## 2021-03-02 DIAGNOSIS — M1990 Unspecified osteoarthritis, unspecified site: Secondary | ICD-10-CM

## 2021-03-02 NOTE — Telephone Encounter (Signed)
  Follow up Call-  Call back number 02/26/2021  Post procedure Call Back phone  # [phone]  Permission to leave phone message Yes  Some recent data might be hidden     Patient questions:  Do you have a fever, pain , or abdominal swelling? No. Pain Score  0 *  Have you tolerated food without any problems? Yes.    Have you been able to return to your normal activities? Yes.    Do you have any questions about your discharge instructions: Diet   No. Medications  No. Follow up visit  No.  Do you have questions or concerns about your Care? No.  Actions: * If pain score is 4 or above: No action needed, pain <4.  1. Have you developed a fever since your procedure? no  2.   Have you had an respiratory symptoms (SOB or cough) since your procedure? no  3.   Have you tested positive for COVID 19 since your procedure no  4.   Have you had any family members/close contacts diagnosed with the COVID 19 since your procedure?  no   If yes to any of these questions please route to Laverna Peace, RN and Karlton Lemon, RN

## 2021-03-02 NOTE — Telephone Encounter (Signed)
Referral to Azucena Fallen at [facility] rheumatology sent

## 2021-03-02 NOTE — Telephone Encounter (Signed)
Caller Dana Johnston Call Back @ [phone]  Patient states she needs a referral to [facility] rheumatologist, patient states they are treating her as a new patient since she hasn't seen them in a while  Please advise

## 2022-01-05 ENCOUNTER — Ambulatory Visit (INDEPENDENT_AMBULATORY_CARE_PROVIDER_SITE_OTHER): Payer: BC Managed Care – PPO | Admitting: Family

## 2022-01-05 ENCOUNTER — Encounter: Payer: Self-pay | Admitting: Family

## 2022-01-05 VITALS — BP 145/63 | HR 73 | Temp 98.4°F | Resp 16 | Ht 63.0 in | Wt 175.6 lb

## 2022-01-05 DIAGNOSIS — Z0000 Encounter for general adult medical examination without abnormal findings: Secondary | ICD-10-CM

## 2022-01-05 HISTORY — DX: Encounter for general adult medical examination without abnormal findings: Z00.00

## 2022-01-05 MED ORDER — HUMIRA (2 SYRINGE) 40 MG/0.4ML ~~LOC~~ PSKT: 40.0000 mg | PREFILLED_SYRINGE | SUBCUTANEOUS | Status: DC

## 2022-01-05 NOTE — Assessment & Plan Note (Signed)
Discussed healthy diet, exercise and weight loss.  Pap/mammo/colo up to date. Recommended that she get the bivalent covid booster at her pharmacy.

## 2022-01-05 NOTE — Progress Notes (Signed)
Subjective:   By signing my name below, I, [name], attest that this documentation has been prepared under the direction and in the presence of [name], NP 01/05/2022     Patient ID: Dana Johnston, female    DOB: 1961/10/20, 61 y.o.   MRN: [id number]  Chief Complaint  Patient presents with   Annual Exam    HPI Patient is in today for a comprehensive physical exam. Rheumatology- She reports taking 40 mg humera and reports no new issues while taking it. She has no new joint pain. She continues having pain in her hand joints.   She denies having any unexpected weight change, ear pain, hearing loss and rhinorrhea, visual disturbance, cough, chest pain and leg swelling, nausea, vomiting, diarrhea, constipation, blood in stool, or dysuria and frequency, for myalgias and arthralgias, rash, headaches, adenopathy, depression or anxiety at this time. Immunizations: She received her flu vaccine December, 2022 at her workplace. She is UTD on both shingles vaccines. She is UTD on tetanus vaccines. She has 3 moderna Covid-19 vaccines at this time. She was informed of the bivalent Covid-19 vaccine.  Diet: She is struggling maintaining a healthy diet since last christmas vacation.  Exercise: She participates in regular exercise by use a row machine at home.  Colonoscopy: Last completed 02/26/2021. Results showed: - The entire examined colon is normal. - Non-bleeding internal hemorrhoids. - No specimens collected. Otherwise results are normal. Repeat in 10 years.  Dexa: Last completed 11/28/2017. Result are normal.  Pap Smear: Last completed 2022. Repeat in 3 years.  Mammogram: Last completed 02/25/2021. Results are normal. Repeat in 1 year. She is interested in setting up an appointment for her next mammogram.  Dental: She is UTD on dental care. She reports having a root canal procedure and 2 crowns placed since seeing her new dentist.  Vision: She is UTD on vision care.   Health  Maintenance Due  Topic Date Due   Hepatitis C Screening  Never done   COVID-19 Vaccine (3 - Moderna risk series) 02/09/2021    Past Medical History:  Diagnosis Date   Allergy    Anxiety    Arthritis    Depression    Hand pain    Osteoarthritis    Polyarthralgia    Rheumatoid arthritis ([place])     Past Surgical History:  Procedure Laterality Date   MEDIAL PARTIAL KNEE REPLACEMENT Left 03/2016   WRIST SURGERY Right 08/27/2018    Family History  Problem Relation Age of Onset   Heart disease Mother        MI at age 44, smoker    Arthritis Father        died at 51 in MVA   Hyperlipidemia Father    Hypertension Father    Arthritis Maternal Grandmother    Heart disease Maternal Grandfather    Colon cancer Neg Hx    Colon polyps Neg Hx    Esophageal cancer Neg Hx    Rectal cancer Neg Hx    Stomach cancer Neg Hx     Social History   Socioeconomic History   Marital status: Married    Spouse name: Not on file   Number of children: Not on file   Years of education: Not on file   Highest education level: Not on file  Occupational History   Not on file  Tobacco Use   Smoking status: Never   Smokeless tobacco: Never  Vaping Use   Vaping Use: Never used  Substance  and Sexual Activity   Alcohol use: Yes    Comment: Occasional margarita   Drug use: No   Sexual activity: Yes    Partners: Female  Other Topics Concern   Not on file  Social History Narrative   Married to [employer]   Works at a Therapist, occupational job   Has 2 children (39 and 74) Adopted from [place]   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     FLUoxetine (PROZAC) 20 MG tablet Take 1.5 tablets (30 mg total) by mouth daily. 30 tablet 3   COVID-19 mRNA vaccine, Moderna, 100 MCG/0.5ML injection INJECT AS DIRECTED .25 mL 0   meloxicam (MOBIC) 15 MG tablet TAKE 1 TABLET BY MOUTH ONCE A DAY WITH FOOD FOR INFLAMMATION (Patient not taking: No sig reported)     No facility-administered medications prior to visit.    Allergies  Allergen Reactions   Other Swelling    nuts   Sulfa Antibiotics Swelling    Review of Systems  Constitutional:        (-)unexpected weight change (-)Adenopathy  HENT:  Negative for hearing loss.        (-)Rhinorrhea   Eyes:        (-)Visual disturbance  Respiratory:  Negative for cough.   Cardiovascular:  Negative for chest pain and leg swelling.  Gastrointestinal:  Negative for blood in stool, constipation, diarrhea, nausea and vomiting.  Genitourinary:  Negative for dysuria and frequency.  Musculoskeletal:  Negative for joint pain and myalgias.  Skin:  Negative for rash.  Neurological:  Negative for headaches.  Psychiatric/Behavioral:  Negative for depression. The patient is not nervous/anxious.       Objective:    Physical Exam Constitutional:      General: She is not in acute distress.    Appearance: Normal appearance. She is not ill-appearing.  HENT:     Head: Normocephalic and atraumatic.     Right Ear: Tympanic membrane, ear canal and external ear normal.     Left Ear: Tympanic membrane, ear canal and external ear normal.  Eyes:     Extraocular Movements: Extraocular movements intact.     Right eye: No nystagmus.     Left eye: No nystagmus.     Pupils: Pupils are equal, round, and reactive to light.  Cardiovascular:     Rate and Rhythm: Normal rate and regular  rhythm.     Heart sounds: Normal heart sounds. No murmur heard.   No gallop.  Pulmonary:     Effort: Pulmonary effort is normal. No respiratory distress.     Breath sounds: Normal breath sounds. No wheezing or rales.  Abdominal:     General: There is no distension.     Palpations: Abdomen is soft.     Tenderness: There is no abdominal tenderness. There is  no guarding.  Skin:    General: Skin is warm and dry.  Neurological:     Mental Status: She is alert and oriented to person, place, and time.     Deep Tendon Reflexes:     Reflex Scores:      Patellar reflexes are 3+ on the right side and 3+ on the left side. Psychiatric:        Behavior: Behavior normal.    BP (!) 145/63    Pulse 73    Temp 98.4 F (36.9 C)    Resp 16    Ht 5\' 3"  (1.6 m)    Wt 175 lb 9.6 oz (79.7 kg)    SpO2 98%    BMI 31.11 kg/m  Wt Readings from Last 3 Encounters:  01/05/22 175 lb 9.6 oz (79.7 kg)  02/26/21 174 lb 6.4 oz (79.1 kg)  02/12/21 174 lb 6.4 oz (79.1 kg)       Assessment & Plan:   Problem List Items Addressed This Visit       Unprioritized   Preventative health care - Primary    Discussed healthy diet, exercise and weight loss.  Pap/mammo/colo up to date. Recommended that she get the bivalent covid booster at her pharmacy.       Relevant Orders   MM 3D SCREEN BREAST BILATERAL     Meds ordered this encounter  Medications   Adalimumab (HUMIRA) 40 MG/0.4ML PSKT    Sig: Inject 40 mg into the skin every 14 (fourteen) days.    Dispense:  2 each    Order Specific Question:   Supervising Provider    Answer:   Pat Patrick, NP, personally preformed the services described in this documentation.  All medical record entries made by the scribe were at my direction and in my presence.  I have reviewed the chart and discharge instructions (if applicable) and agree that the record reflects my personal performance and is accurate and complete.  01/05/2022   I,[name],acting as a Education administrator for Nance Pear, NP.,have documented all relevant documentation on the behalf of Nance Pear, NP,as directed by  Nance Pear, NP while in the presence of Nance Pear, NP.   Nance Pear, NP

## 2022-02-11 ENCOUNTER — Telehealth: Payer: Self-pay | Admitting: Family

## 2022-02-11 ENCOUNTER — Ambulatory Visit: Payer: BC Managed Care – PPO | Admitting: Family

## 2022-02-11 VITALS — BP 144/61 | HR 63 | Temp 98.3°F | Resp 16 | Wt 182.0 lb

## 2022-02-11 DIAGNOSIS — R030 Elevated blood-pressure reading, without diagnosis of hypertension: Secondary | ICD-10-CM | POA: Diagnosis not present

## 2022-02-11 DIAGNOSIS — I10 Essential (primary) hypertension: Secondary | ICD-10-CM | POA: Insufficient documentation

## 2022-02-11 DIAGNOSIS — R5383 Other fatigue: Secondary | ICD-10-CM | POA: Diagnosis not present

## 2022-02-11 MED ORDER — AMLODIPINE BESYLATE 2.5 MG PO TABS: 2.5000 mg | ORAL_TABLET | Freq: Every day | ORAL | 0 refills | Status: DC

## 2022-02-11 NOTE — Progress Notes (Addendum)
? ?Subjective:  ? ?By signing my name below, I, Dana Johnston, attest that this documentation has been prepared under the direction and in the presence of [name] NP, 02/11/2022   ? ? Patient ID: Dana Johnston, female    DOB: June 08, 1961, 61 y.o.   MRN: [id number] ? ?Chief Complaint  ?Patient presents with  ? Fatigue  ?  Complains of feeling more tired than usual in the last few weeks.  ? ? ?HPI ?Patient is in today for an office visit. ? ?Fatigue - She complains of elevated fatigue for the last few weeks. She has been exercising. Her mood has been well. She had a cold prior to the onset of fatigue so she took a COVID - 19 at home test and it came back negative. She denies of any new or unusual stress. She is treated by Rheumatology for RA but states that there have been no changes to her medications. She does snore, but there are now witnessed apneas and she has not been told that her snoring has worsened recently.  ? ?Health Maintenance Due  ?Topic Date Due  ? Hepatitis C Screening  Never done  ? COVID-19 Vaccine (3 - Moderna risk series) 02/09/2021  ? ? ?Past Medical History:  ?Diagnosis Date  ? Allergy   ? Anxiety   ? Arthritis   ? Depression   ? Hand pain   ? Osteoarthritis   ? Polyarthralgia   ? Rheumatoid arthritis ([place])   ? ? ?Past Surgical History:  ?Procedure Laterality Date  ? MEDIAL PARTIAL KNEE REPLACEMENT Left 03/2016  ? WRIST SURGERY Right 08/27/2018  ? ? ?Family History  ?Problem Relation Age of Onset  ? Heart disease Mother   ?     MI at age 32, smoker   ? Arthritis Father   ?     died at 49 in [place]  ? Hyperlipidemia Father   ? Hypertension Father   ? Arthritis Maternal Grandmother   ? Heart disease Maternal Grandfather   ? Colon cancer Neg Hx   ? Colon polyps Neg Hx   ? Esophageal cancer Neg Hx   ? Rectal cancer Neg Hx   ? Stomach cancer Neg Hx   ? ? ?Social History  ? ?Socioeconomic History  ? Marital status: Married  ?  Spouse name: Not on file  ? Number of children: Not on file  ? Years  of education: Not on file  ? Highest education level: Not on file  ?Occupational History  ? Not on file  ?Tobacco Use  ? Smoking status: Never  ? Smokeless tobacco: Never  ?Vaping Use  ? Vaping Use: Never used  ?Substance and Sexual Activity  ? Alcohol use: Yes  ?  Comment: Occasional margarita  ? Drug use: No  ? Sexual activity: Yes  ?  Partners: Female  ?Other Topics Concern  ? Not on file  ?Social History Narrative  ? Married to [employer]  ? Works at a Therapist, occupational job  ? Has 2 children (19 and 16) Adopted from [place]  ? 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 3 dogs  ? 5 cats  ? Does rescue work for foster cats in [place]  ? Completed 2 years of college  ? Enjoys yard work, relaxing  ? ?Social Determinants of Health  ? ?Financial Resource Strain: Not on file  ?Food Insecurity: Not on file  ?Transportation Needs: Not on file  ?Physical Activity: Not  on file  ?Stress: Not on file  ?Social Connections: Not on file  ?Intimate Partner Violence: Not on file  ? ? ?Outpatient Medications Prior to Visit  ?Medication Sig Dispense Refill  ? Adalimumab (HUMIRA) 40 MG/0.4ML PSKT Inject 40 mg into the skin every 14 (fourteen) days. 2 each   ? fexofenadine (ALLEGRA) 60 MG tablet Take 60 mg by mouth 2 (two) times daily.    ? FLUoxetine (PROZAC) 20 MG tablet Take 1.5 tablets (30 mg total) by mouth daily. 30 tablet 3  ? ?No facility-administered medications prior to visit.  ? ? ?Allergies  ?Allergen Reactions  ? Other Swelling  ?  nuts  ? Sulfa Antibiotics Swelling  ? ? ?Review of Systems  ?Constitutional:  Positive for malaise/fatigue.  ?Psychiatric/Behavioral:  The patient does not have insomnia.   ? ?   ?Objective:  ?  ?Physical Exam ?Constitutional:   ?   General: She is not in acute distress. ?   Appearance: Normal appearance. She is not ill-appearing.  ?HENT:  ?   Head: Normocephalic and atraumatic.  ?   Right Ear: External ear normal.  ?   Left Ear: External ear normal.  ?Eyes:  ?    Extraocular Movements: Extraocular movements intact.  ?   Pupils: Pupils are equal, round, and reactive to light.  ?Cardiovascular:  ?   Rate and Rhythm: Normal rate and regular rhythm.  ?   Heart sounds: Normal heart sounds. No murmur heard. ?  No gallop.  ?Pulmonary:  ?   Effort: Pulmonary effort is normal. No respiratory distress.  ?   Breath sounds: Normal breath sounds. No wheezing or rales.  ?Skin: ?   General: Skin is warm and dry.  ?Neurological:  ?   Mental Status: She is alert and oriented to person, place, and time.  ?Psychiatric:     ?   Mood and Affect: Mood normal.     ?   Behavior: Behavior normal.     ?   Judgment: Judgment normal.  ? ? ?BP (!) 144/61 (BP Location: Right Arm, Patient Position: Sitting, Cuff Size: Small)   Pulse 63   Temp 98.3 ?F (36.8 ?C) (Oral)   Resp 16   Wt 182 lb (82.6 kg)   SpO2 100%   BMI 32.24 kg/m?  ?Wt Readings from Last 3 Encounters:  ?02/11/22 182 lb (82.6 kg)  ?01/05/22 175 lb 9.6 oz (79.7 kg)  ?02/26/21 174 lb 6.4 oz (79.1 kg)  ? ? ?   ?Assessment & Plan:  ? ?Problem List Items Addressed This Visit   ? ?  ? Unprioritized  ? Other fatigue - Primary  ?  Wt Readings from Last 3 Encounters:  ?02/11/22 182 lb (82.6 kg)  ?01/05/22 175 lb 9.6 oz (79.7 kg)  ?02/26/21 174 lb 6.4 oz (79.1 kg)  ?She has had some weight gain. She attributes this to being less active due to fatigue. Hypothyroid is a possibility as well. Will obtain labs as below. It is also possible that her viral illness that she had prior to the onset of her fatigue was in fact covid with false negative home test and she is experiencing some long covid symptoms. She has had several weeks of fatigue following a confirmed case of covid.  ?  ?  ? Relevant Orders  ? Comp Met (CMET)  ? CBC with Differential/Platelet  ? TSH  ? Elevated blood pressure reading  ?  BP has been running a bit high on last  two checks.  Dad had hypertension.  ?Will add amlodipine 2.$RemoveBeforeDE'5mg'[id number]$  once daily.  ?BP Readings from Last 3 Encounters:   ?02/11/22 (!) 144/61  ?01/05/22 (!) 145/63  ?02/26/21 (!) 139/53  ? ?  ?  ? ? ? ? ?Meds ordered this encounter  ?Medications  ? amLODipine (NORVASC) 2.5 MG tablet  ?  Sig: Take 1 tablet (2.5 mg total) by mouth daily.  ?  Dispense:  90 tablet  ?  Refill:  0  ?  Order Specific Question:   Supervising Provider  ?  Answer:   Penni Homans A [[id number]]  ? ? ?I, Nance Pear, NP, personally preformed the services described in this documentation.  All medical record entries made by the scribe were at my direction and in my presence.  I have reviewed the chart and discharge instructions (if applicable) and agree that the record reflects my personal performance and is accurate and complete. 02/11/2022 ? ? ?I,Amber Collins,acting as a Education administrator for [employer], NP.,have documented all relevant documentation on the behalf of Nance Pear, NP,as directed by  Nance Pear, NP while in the presence of Nance Pear, NP. ? ? ? ?Nance Pear, NP ? ?

## 2022-02-11 NOTE — Patient Instructions (Signed)
Please complete lab work prior to leaving.   

## 2022-02-11 NOTE — Telephone Encounter (Signed)
See mychart.  

## 2022-02-11 NOTE — Assessment & Plan Note (Addendum)
Wt Readings from Last 3 Encounters:  ?02/11/22 182 lb (82.6 kg)  ?01/05/22 175 lb 9.6 oz (79.7 kg)  ?02/26/21 174 lb 6.4 oz (79.1 kg)  ? ?She has had some weight gain. She attributes this to being less active due to fatigue. Hypothyroid is a possibility as well. Will obtain labs as below. It is also possible that her viral illness that she had prior to the onset of her fatigue was in fact covid with false negative home test and she is experiencing some long covid symptoms. She has had several weeks of fatigue following a confirmed case of covid.  ?

## 2022-02-11 NOTE — Assessment & Plan Note (Signed)
BP has been running a bit high on last two checks.  Dad had hypertension.  ?Will add amlodipine 2.5mg  once daily.  ?BP Readings from Last 3 Encounters:  ?02/11/22 (!) 144/61  ?01/05/22 (!) 145/63  ?02/26/21 (!) 139/53  ? ? ?

## 2022-02-12 LAB — CBC WITH DIFFERENTIAL/PLATELET
Absolute Monocytes: 570 cells/uL (ref 200–950)
Basophils Absolute: 67 cells/uL (ref 0–200)
Basophils Relative: 1 %
Eosinophils Absolute: 328 cells/uL (ref 15–500)
Eosinophils Relative: 4.9 %
HCT: 40.7 % (ref 35.0–45.0)
Hemoglobin: 13.3 g/dL (ref 11.7–15.5)
Lymphs Abs: 2124 cells/uL (ref 850–3900)
MCH: 30.2 pg (ref 27.0–33.0)
MCHC: 32.7 g/dL (ref 32.0–36.0)
MCV: 92.5 fL (ref 80.0–100.0)
MPV: 9.6 fL (ref 7.5–12.5)
Monocytes Relative: 8.5 %
Neutro Abs: 3611 cells/uL (ref 1500–7800)
Neutrophils Relative %: 53.9 %
Platelets: 295 10*3/uL (ref 140–400)
RBC: 4.4 10*6/uL (ref 3.80–5.10)
RDW: 11.8 % (ref 11.0–15.0)
Total Lymphocyte: 31.7 %
WBC: 6.7 10*3/uL (ref 3.8–10.8)

## 2022-02-12 LAB — COMPREHENSIVE METABOLIC PANEL
AG Ratio: 1.4 (calc) (ref 1.0–2.5)
ALT: 12 U/L (ref 6–29)
AST: 15 U/L (ref 10–35)
Albumin: 3.9 g/dL (ref 3.6–5.1)
Alkaline phosphatase (APISO): 110 U/L (ref 37–153)
BUN/Creatinine Ratio: 16 (calc) (ref 6–22)
BUN: 19 mg/dL (ref 7–25)
CO2: 22 mmol/L (ref 20–32)
Calcium: 9.2 mg/dL (ref 8.6–10.4)
Chloride: 107 mmol/L (ref 98–110)
Creat: 1.21 mg/dL — ABNORMAL HIGH (ref 0.50–1.03)
Globulin: 2.8 g/dL (calc) (ref 1.9–3.7)
Glucose, Bld: 89 mg/dL (ref 65–99)
Potassium: 4.1 mmol/L (ref 3.5–5.3)
Sodium: 141 mmol/L (ref 135–146)
Total Bilirubin: 0.3 mg/dL (ref 0.2–1.2)
Total Protein: 6.7 g/dL (ref 6.1–8.1)

## 2022-02-12 LAB — TSH: TSH: 2.22 mIU/L (ref 0.40–4.50)

## 2022-02-21 ENCOUNTER — Inpatient Hospital Stay (HOSPITAL_BASED_OUTPATIENT_CLINIC_OR_DEPARTMENT_OTHER): Admission: RE | Admit: 2022-02-21 | Payer: BC Managed Care – PPO | Source: Ambulatory Visit

## 2022-03-16 ENCOUNTER — Ambulatory Visit (HOSPITAL_BASED_OUTPATIENT_CLINIC_OR_DEPARTMENT_OTHER): Payer: BC Managed Care – PPO

## 2022-03-23 ENCOUNTER — Encounter (HOSPITAL_BASED_OUTPATIENT_CLINIC_OR_DEPARTMENT_OTHER): Payer: Self-pay

## 2022-03-23 ENCOUNTER — Ambulatory Visit (HOSPITAL_BASED_OUTPATIENT_CLINIC_OR_DEPARTMENT_OTHER)
Admission: RE | Admit: 2022-03-23 | Discharge: 2022-03-23 | Disposition: A | Discharge status: Home / Self Care | Payer: BC Managed Care – PPO | Source: Ambulatory Visit | Attending: Family | Admitting: Family

## 2022-03-23 DIAGNOSIS — Z0000 Encounter for general adult medical examination without abnormal findings: Secondary | ICD-10-CM | POA: Diagnosis present

## 2022-03-23 DIAGNOSIS — Z1231 Encounter for screening mammogram for malignant neoplasm of breast: Secondary | ICD-10-CM | POA: Insufficient documentation

## 2022-03-25 ENCOUNTER — Ambulatory Visit: Payer: BC Managed Care – PPO | Admitting: Family

## 2022-03-25 DIAGNOSIS — I10 Essential (primary) hypertension: Secondary | ICD-10-CM

## 2022-03-25 DIAGNOSIS — R5383 Other fatigue: Secondary | ICD-10-CM

## 2022-03-25 MED ORDER — AMLODIPINE BESYLATE 2.5 MG PO TABS: 2.5000 mg | ORAL_TABLET | Freq: Every day | ORAL | 0 refills | Status: DC

## 2022-03-25 MED ORDER — FLUOXETINE HCL 20 MG PO TABS: 30.0000 mg | ORAL_TABLET | Freq: Every day | ORAL | 1 refills | Status: DC

## 2022-03-25 NOTE — Assessment & Plan Note (Signed)
Improving, lab work was unremarkable last visit.  Will monitor.  ?

## 2022-03-25 NOTE — Assessment & Plan Note (Addendum)
BP Readings from Last 3 Encounters:  ?03/25/22 135/60  ?02/11/22 (!) 144/61  ?01/05/22 (!) 145/63  ? ?Stable/improved. Continue amlodipine 2.5mg  once daily.  ?

## 2022-03-25 NOTE — Progress Notes (Signed)
? ?Subjective:  ? ?By signing my name below, I, Cassell Clement, attest that this documentation has been prepared under the direction and in the presence of Sandford Craze NP, 03/25/2022   ? ? Patient ID: Dana Johnston, female    DOB: [birth year], 61 y.o.   MRN: [id number] ? ?Chief Complaint  ?Patient presents with  ? Hypertension  ?  Here for follow up  ? ? ?HPI ?Patient is in today for an office visit. ? ?Blood Pressure - Her blood pressure is improving. She is taking 2.5 MG of Amlodipine. She reports no side effects using Amlodipine. She has not been checking her blood pressure at home.  ?BP Readings from Last 3 Encounters:  ?03/25/22 135/60  ?02/11/22 (!) 144/61  ?01/05/22 (!) 145/63  ? ?Pulse Readings from Last 3 Encounters:  ?03/25/22 64  ?02/11/22 63  ?01/05/22 73  ? ?Fatigue - She is still tired but not symptoms are not quite as bad as before. She has started walking more often.  ?Weight - She is concentrating on eating right and exercising more.  ?Wt Readings from Last 3 Encounters:  ?03/25/22 180 lb (81.6 kg)  ?02/11/22 182 lb (82.6 kg)  ?01/05/22 175 lb 9.6 oz (79.7 kg)  ? ?Health Maintenance Due  ?Topic Date Due  ? Hepatitis C Screening  Never done  ? COVID-19 Vaccine (3 - Moderna risk series) 02/09/2021  ? ? ?Past Medical History:  ?Diagnosis Date  ? Allergy   ? Anxiety   ? Arthritis   ? Depression   ? Hand pain   ? Osteoarthritis   ? Polyarthralgia   ? Rheumatoid arthritis (HCC)   ? ? ?Past Surgical History:  ?Procedure Laterality Date  ? MEDIAL PARTIAL KNEE REPLACEMENT Left 03/2016  ? WRIST SURGERY Right 08/27/2018  ? ? ?Family History  ?Problem Relation Age of Onset  ? Heart disease Mother   ?     MI at age 81, smoker   ? Arthritis Father   ?     died at 97 in MVA  ? Hyperlipidemia Father   ? Hypertension Father   ? Arthritis Maternal Grandmother   ? Heart disease Maternal Grandfather   ? Colon cancer Neg Hx   ? Colon polyps Neg Hx   ? Esophageal cancer Neg Hx   ? Rectal cancer Neg Hx   ? Stomach  cancer Neg Hx   ? ? ?Social History  ? ?Socioeconomic History  ? Marital status: Married  ?  Spouse name: Not on file  ? Number of children: Not on file  ? Years of education: Not on file  ? Highest education level: Not on file  ?Occupational History  ? Not on file  ?Tobacco Use  ? Smoking status: Never  ? Smokeless tobacco: Never  ?Vaping Use  ? Vaping Use: Never used  ?Substance and Sexual Activity  ? Alcohol use: Yes  ?  Comment: Occasional margarita  ? Drug use: No  ? Sexual activity: Yes  ?  Partners: Female  ?Other Topics Concern  ? Not on file  ?Social History Narrative  ? Married to [employer]  ? Works at a Health visitor job  ? Has 2 children (19 and 16) Adopted from [place]  ? 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 3 dogs  ? 5 cats  ? Does rescue work for foster cats in [place]  ? Completed 2 years of college  ? Enjoys yard work, relaxing  ? ?  Social Determinants of Health  ? ?Financial Resource Strain: Not on file  ?Food Insecurity: Not on file  ?Transportation Needs: Not on file  ?Physical Activity: Not on file  ?Stress: Not on file  ?Social Connections: Not on file  ?Intimate Partner Violence: Not on file  ? ? ?Outpatient Medications Prior to Visit  ?Medication Sig Dispense Refill  ? Adalimumab (HUMIRA) 40 MG/0.4ML PSKT Inject 40 mg into the skin every 14 (fourteen) days. 2 each   ? fexofenadine (ALLEGRA) 60 MG tablet Take 60 mg by mouth 2 (two) times daily.    ? amLODipine (NORVASC) 2.5 MG tablet Take 1 tablet (2.5 mg total) by mouth daily. 90 tablet 0  ? FLUoxetine (PROZAC) 20 MG tablet Take 1.5 tablets (30 mg total) by mouth daily. 30 tablet 3  ? ?No facility-administered medications prior to visit.  ? ? ?Allergies  ?Allergen Reactions  ? Other Swelling  ?  nuts  ? Sulfa Antibiotics Swelling  ? ? ?ROS ? ?   ?Objective:  ?  ?Physical Exam ?Constitutional:   ?   General: She is not in acute distress. ?   Appearance: Normal appearance. She is not ill-appearing.  ?HENT:   ?   Head: Normocephalic and atraumatic.  ?   Right Ear: External ear normal.  ?   Left Ear: External ear normal.  ?Eyes:  ?   Extraocular Movements: Extraocular movements intact.  ?   Pupils: Pupils are equal, round, and reactive to light.  ?Cardiovascular:  ?   Rate and Rhythm: Normal rate and regular rhythm.  ?   Heart sounds: Normal heart sounds. No murmur heard. ?  No gallop.  ?Pulmonary:  ?   Effort: Pulmonary effort is normal. No respiratory distress.  ?   Breath sounds: Normal breath sounds. No wheezing or rales.  ?Skin: ?   General: Skin is warm and dry.  ?Neurological:  ?   Mental Status: She is alert and oriented to person, place, and time.  ?Psychiatric:     ?   Mood and Affect: Mood normal.     ?   Behavior: Behavior normal.     ?   Judgment: Judgment normal.  ? ? ?BP 135/60 (BP Location: Right Arm, Patient Position: Sitting, Cuff Size: Small)   Pulse 64   Temp 98.4 ?F (36.9 ?C) (Oral)   Resp 16   Wt 180 lb (81.6 kg)   SpO2 99%   BMI 31.89 kg/m?  ?Wt Readings from Last 3 Encounters:  ?03/25/22 180 lb (81.6 kg)  ?02/11/22 182 lb (82.6 kg)  ?01/05/22 175 lb 9.6 oz (79.7 kg)  ? ? ?   ?Assessment & Plan:  ? ?Problem List Items Addressed This Visit   ? ?  ? Unprioritized  ? Other fatigue  ?  Improving, lab work was unremarkable last visit.  Will monitor.  ? ?  ?  ? Hypertension  ?  BP Readings from Last 3 Encounters:  ?03/25/22 135/60  ?02/11/22 (!) 144/61  ?01/05/22 (!) 145/63  ?Stable/improved. Continue amlodipine 2.5mg  once daily.  ?  ?  ? Relevant Medications  ? amLODipine (NORVASC) 2.5 MG tablet  ? ? ? ? ?Meds ordered this encounter  ?Medications  ? amLODipine (NORVASC) 2.5 MG tablet  ?  Sig: Take 1 tablet (2.5 mg total) by mouth daily.  ?  Dispense:  90 tablet  ?  Refill:  0  ?  Order Specific Question:   Supervising Provider  ?  Answer:  BLYTH, STACEY A [[id number]]  ? FLUoxetine (PROZAC) 20 MG tablet  ?  Sig: Take 1.5 tablets (30 mg total) by mouth daily.  ?  Dispense:  90 tablet  ?  Refill:  1  ?   Order Specific Question:   Supervising Provider  ?  Answer:   Danise Edge A [[id number]]  ? ? ?I, Lemont Fillers, NP, personally preformed the services described in this documentation.  All medical record entries made by the scribe were at my direction and in my presence.  I have reviewed the chart and discharge instructions (if applicable) and agree that the record reflects my personal performance and is accurate and complete. 03/25/2022 ? ? ?I,Amber Collins,acting as a Neurosurgeon for [employer], NP.,have documented all relevant documentation on the behalf of Lemont Fillers, NP,as directed by  Lemont Fillers, NP while in the presence of Lemont Fillers, NP. ? ? ? ?Lemont Fillers, NP ? ?

## 2022-06-23 ENCOUNTER — Encounter (HOSPITAL_BASED_OUTPATIENT_CLINIC_OR_DEPARTMENT_OTHER): Payer: Self-pay

## 2022-06-23 ENCOUNTER — Other Ambulatory Visit: Payer: Self-pay

## 2022-06-23 ENCOUNTER — Emergency Department (HOSPITAL_BASED_OUTPATIENT_CLINIC_OR_DEPARTMENT_OTHER)
Admission: EM | Admit: 2022-06-23 | Discharge: 2022-06-23 | Disposition: A | Discharge status: Home / Self Care | Payer: BC Managed Care – PPO | Attending: Emergency Medicine | Admitting: Emergency Medicine

## 2022-06-23 ENCOUNTER — Telehealth: Payer: Self-pay | Admitting: Family

## 2022-06-23 ENCOUNTER — Emergency Department (HOSPITAL_BASED_OUTPATIENT_CLINIC_OR_DEPARTMENT_OTHER): Payer: BC Managed Care – PPO

## 2022-06-23 DIAGNOSIS — I10 Essential (primary) hypertension: Secondary | ICD-10-CM | POA: Insufficient documentation

## 2022-06-23 DIAGNOSIS — D72829 Elevated white blood cell count, unspecified: Secondary | ICD-10-CM | POA: Insufficient documentation

## 2022-06-23 DIAGNOSIS — R0789 Other chest pain: Secondary | ICD-10-CM | POA: Diagnosis not present

## 2022-06-23 DIAGNOSIS — Z79899 Other long term (current) drug therapy: Secondary | ICD-10-CM | POA: Insufficient documentation

## 2022-06-23 DIAGNOSIS — Z20822 Contact with and (suspected) exposure to covid-19: Secondary | ICD-10-CM | POA: Diagnosis not present

## 2022-06-23 LAB — BASIC METABOLIC PANEL
Anion gap: 7 (ref 5–15)
BUN: 15 mg/dL (ref 6–20)
CO2: 24 mmol/L (ref 22–32)
Calcium: 8.9 mg/dL (ref 8.9–10.3)
Chloride: 109 mmol/L (ref 98–111)
Creatinine, Ser: 1.01 mg/dL — ABNORMAL HIGH (ref 0.44–1.00)
GFR, Estimated: >60 mL/min (ref >60)
Glucose, Bld: 99 mg/dL (ref 70–99)
Potassium: 4.2 mmol/L (ref 3.5–5.1)
Sodium: 140 mmol/L (ref 135–145)

## 2022-06-23 LAB — TROPONIN I (HIGH SENSITIVITY)
Troponin I (High Sensitivity): <2 ng/L (ref <18)
Troponin I (High Sensitivity): <2 ng/L (ref <18)

## 2022-06-23 LAB — CBC
HCT: 41.5 % (ref 36.0–46.0)
Hemoglobin: 13.7 g/dL (ref 12.0–15.0)
MCH: 30.9 pg (ref 26.0–34.0)
MCHC: 33 g/dL (ref 30.0–36.0)
MCV: 93.5 fL (ref 80.0–100.0)
Platelets: 279 10*3/uL (ref 150–400)
RBC: 4.44 MIL/uL (ref 3.87–5.11)
RDW: 12.1 % (ref 11.5–15.5)
WBC: 5.3 10*3/uL (ref 4.0–10.5)
nRBC: 0 % (ref 0.0–0.2)

## 2022-06-23 LAB — RESP PANEL BY RT-PCR (FLU A&B, COVID) ARPGX2
Influenza A by PCR: NEGATIVE
Influenza B by PCR: NEGATIVE
SARS Coronavirus 2 by RT PCR: NEGATIVE

## 2022-06-23 LAB — D-DIMER, QUANTITATIVE: D-Dimer, Quant: 0.29 ug/mL-FEU (ref 0.00–0.50)

## 2022-06-23 MED ORDER — PANTOPRAZOLE SODIUM 20 MG PO TBEC: 20.0000 mg | DELAYED_RELEASE_TABLET | Freq: Every day | ORAL | 0 refills | Status: DC

## 2022-06-23 NOTE — ED Triage Notes (Signed)
Pt accompanied by wife. Reports HA, tired and chest tightness since yesterday.Denies cough/ SOB. Mild tightness today

## 2022-06-23 NOTE — Telephone Encounter (Signed)
Pt in ED.  

## 2022-06-23 NOTE — ED Provider Notes (Signed)
[facility] EMERGENCY DEPARTMENT Provider Note   CSN: [id number] Arrival date & time: 06/23/22  5366     History  Chief Complaint  Patient presents with   Chest Pain    Dana Johnston is a 61 y.o. female with history of rheumatoid arthritis, obesity, hypertension.  Patient presents today for evaluation of central chest pain described as a soreness onset yesterday, no clear inciting event patient was sitting at her desk at work when pain began.  Pain comes on suddenly and lasts around 20-30 minutes before resolving without intervention patient has not tried any medications help with her symptoms.  She is currently chest pain-free.  Pain does not radiate.  Patient denies similar pain in the past.    She denies any history of fever, chills, recent illness, nausea, vomiting, diarrhea, abdominal pain, diaphoresis, history of blood clot, recent surgery/immobilization, extremity swelling, history of cancer, exogenous hormone, shortness of breath, pain with laying flat or any additional concerns.  Patient does report that her mother suffered an MI at the age of 58, no other known female heart disease.  Patient reports that she has never smoked, she does report history of hypertension controlled with amlodipine.  HPI     Home Medications Prior to Admission medications   Medication Sig Start Date End Date Taking? Authorizing Provider  amLODipine (NORVASC) 2.5 MG tablet Take 1 tablet (2.5 mg total) by mouth daily. 03/25/22  Yes Sandford Craze, NP  fexofenadine (ALLEGRA) 60 MG tablet Take 60 mg by mouth 2 (two) times daily.   Yes [provider]  FLUoxetine (PROZAC) 20 MG tablet Take 1.5 tablets (30 mg total) by mouth daily. 03/25/22  Yes Sandford Craze, NP  pantoprazole (PROTONIX) 20 MG tablet Take 1 tablet (20 mg total) by mouth daily. 06/23/22  Yes Harlene Salts A, PA-C  Adalimumab (HUMIRA) 40 MG/0.4ML PSKT Inject 40 mg into the skin every 14 (fourteen) days. 01/05/22    Sandford Craze, NP      Allergies    Other and Sulfa antibiotics    Review of Systems   Review of Systems Ten systems are reviewed and are negative for acute change except as noted in the HPI  Physical Exam Updated Vital Signs BP 114/63   Pulse 63   Temp 97.8 F (36.6 C)   Resp (!) 21   Ht  (1.575 m)   Wt 74.8 kg   SpO2 99%   BMI 30.18 kg/m  Physical Exam Constitutional:      General: She is not in acute distress.    Appearance: Normal appearance. She is well-developed. She is not ill-appearing or diaphoretic.  HENT:     Head: Normocephalic and atraumatic.  Eyes:     General: Vision grossly intact. Gaze aligned appropriately.     Pupils: Pupils are equal, round, and reactive to light.  Neck:     Trachea: Trachea and phonation normal.  Cardiovascular:     Rate and Rhythm: Normal rate and regular rhythm.     Pulses:          Radial pulses are 2+ on the right side and 2+ on the left side.       Dorsalis pedis pulses are 2+ on the right side and 2+ on the left side.     Heart sounds: Normal heart sounds.  Pulmonary:     Effort: Pulmonary effort is normal. No respiratory distress.     Breath sounds: Normal breath sounds.  Chest:  Chest wall: Tenderness (Mild TTP to the sternum) present.  Abdominal:     General: There is no distension.     Palpations: Abdomen is soft.     Tenderness: There is no abdominal tenderness. There is no guarding or rebound.  Musculoskeletal:        General: Normal range of motion.     Cervical back: Normal range of motion.     Right lower leg: No tenderness. No edema.     Left lower leg: No tenderness. No edema.  Skin:    General: Skin is warm and dry.  Neurological:     Mental Status: She is alert.     GCS: GCS eye subscore is 4. GCS verbal subscore is 5. GCS motor subscore is 6.     Comments: Speech is clear and goal oriented, follows commands Major Cranial nerves without deficit, no facial droop Moves extremities without  ataxia, coordination intact  Psychiatric:        Behavior: Behavior normal.     ED Results / Procedures / Treatments   Labs (all labs ordered are listed, but only abnormal results are displayed) Labs Reviewed  BASIC METABOLIC PANEL - Abnormal; Notable for the following components:      Result Value   Creatinine, Ser 1.01 (*)    All other components within normal limits  RESP PANEL BY RT-PCR (FLU A&B, COVID) ARPGX2  CBC  D-DIMER, QUANTITATIVE  TROPONIN I (HIGH SENSITIVITY)  TROPONIN I (HIGH SENSITIVITY)    EKG EKG Interpretation  Date/Time:  Thursday June 23 2022 10:04:43 EDT Ventricular Rate:  67 PR Interval:  131 QRS Duration: 83 QT Interval:  397 QTC Calculation: 420 R Axis:   48 Text Interpretation: Sinus rhythm Probable left atrial enlargement Low voltage, precordial leads Similar to 2018 tracing Confirmed by Alona Bene [phone]) on 06/23/2022 12:26:50 PM  Radiology DG Chest 2 View  Result Date: 06/23/2022 CLINICAL DATA:  Chest tightness. EXAM: CHEST - 2 VIEW COMPARISON:  Chest x-ray dated Mar 27, 2020. FINDINGS: The heart size and mediastinal contours are within normal limits. Both lungs are clear. The visualized skeletal structures are unremarkable. IMPRESSION: No active cardiopulmonary disease. Electronically Signed   By: Obie Dredge M.D.   On: 06/23/2022 11:00    Procedures Procedures    Medications Ordered in ED Medications - No data to display  ED Course/ Medical Decision Making/ A&P Clinical Course as of 06/23/22 1320  Thu Jun 23, 2022  1031 ED EKG I have personally reviewed and interpreted patient's twelve-lead EKG.  I do not appreciate any obvious acute ischemic changes.  Sinus rhythm. [BM]  1031 CBC CBC does not show any leukocytosis to suggest infectious process.  Hemoglobin of 13.7, doubt symptomatic anemia.  No thrombocytopenia [BM]  1051 DG Chest 2 View I have personally reviewed and interpreted patient's two-view chest x-ray.  I do not  appreciate any obvious PTX, PNA or other acute cardiopulmonary process. [BM]  1144 Troponin I (High Sensitivity) High-sensitivity troponin is negative, reassuring the setting of 1 day of chest pain.  Low suspicion for ACS. [BM]    Clinical Course User Index [BM] Elizabeth Palau                           Medical Decision Making 61 year old female with 1 day of central chest pain.  Pain at last 20 was 30 minutes before resolving, is not exertional.  She is currently chest pain-free.  Her anterior chest wall is somewhat tender to palpation.  Cardiopulmonary dam is unremarkable.  Differential for chest pain includes but not limited to ACS, PE, dissection, PTX, PNA, costochondritis, GERD.  Cardiac work-up initiated.  Amount and/or Complexity of Data Reviewed Independent Historian: spouse External Data Reviewed: notes. Labs: ordered. Decision-making details documented in ED Course. Radiology: ordered. Decision-making details documented in ED Course. ECG/medicine tests: ordered. Decision-making details documented in ED Course.  Risk Risk Details: Patient's work-up today is overall reassuring.  She has not experienced any chest pain throughout her ER visit.  Vital signs are stable on room air.  With negative troponin in the setting of 1 day of chest pain is reassuring, no indication for a delta troponin overall low suspicion for ACS at this time.  Patient's heart score is less than 4 however given her age and history of hypertension will place ambulatory referral to cardiology for follow-up.  Dr. Jacqulyn Bath in agreement with plan.  Other etiologies of chest pain were considered, pulmonary embolism thought to be unlikely, patient is low risk by Anner Crete materia, did not meet PERC criteria given her age so D-dimer was obtained and is negative.  Low suspicion for PE at this time.  Patient has no radiation of pain, reassuring blood pressure and intact and equal peripheral pulses and neuro exam low suspicion  for dissection or aortic aneurysm as cause of patient's symptoms at this time.  Additionally with no recent infectious symptoms and reassuring chest x-ray low suspicion for PNA.  Reassuring pulmonary examination and negative x-ray also low suspicion for PTX.  Possible patient is experiencing costochondritis versus acid reflux today.  She has a nontender abdomen no vomiting/diarrhea.  I discussed trying over-the-counter Tylenol to help with possible inflammation and patient also elected for a prescription of Protonix for possible acid reflux related pain.  I discussed strict ER precautions with the patient and her wife at bedside.  Case and results were reviewed with Dr. Jacqulyn Bath today who agrees with work-up and discharge at this time.     At this time there does not appear to be any evidence of an acute emergency medical condition and the patient appears stable for discharge with appropriate outpatient follow up. Diagnosis was discussed with patient who verbalizes understanding of care plan and is agreeable to discharge. I have discussed return precautions with patient who verbalizes understanding. Patient encouraged to follow-up with their PCP and cardiology. All questions answered.   Note: Portions of this report may have been transcribed using voice recognition software. Every effort was made to ensure accuracy; however, inadvertent computerized transcription errors may still be present.         Final Clinical Impression(s) / ED Diagnoses Final diagnoses:  Atypical chest pain    Rx / DC Orders ED Discharge Orders          Ordered    pantoprazole (PROTONIX) 20 MG tablet  Daily        06/23/22 1311    Ambulatory referral to Cardiology       Comments: If you have not heard from the Cardiology office within the next 72 hours please call [phone].   06/23/22 1314              Elizabeth Palau 06/23/22 1320    Maia Plan, MD 06/27/22 [phone]

## 2022-06-23 NOTE — Telephone Encounter (Signed)
Nurse Assessment Nurse: Lily Kocher, RN, Adriana Date/Time (Eastern Time): 06/23/2022 9:38:56 AM Confirm and document reason for call. If symptomatic, describe symptoms. ---pt reports fatigue, cold, chest pain and headache, chest pain is intermittent. 2/10, present now, sore. has been ongoing for 30-40 min Does the patient have any new or worsening symptoms? ---Yes Will a triage be completed? ---Yes Related visit to physician within the last 2 weeks? ---No Does the PT have any chronic conditions? (i.e. diabetes, asthma, this includes High risk factors for pregnancy, etc.) ---Yes List chronic conditions. ---htn arthritis anxiety Is this a behavioral health or substance abuse call? ---No Guidelines Guideline Title Affirmed Question Affirmed Notes Nurse Date/Time Lamount Cohen Time) Chest Pain [1] Chest pain lasts > 5 minutes AND [2] age > 52 Lily Kocher, RN, Ricki Rodriguez 06/23/2022 9:41:24 AM Disp. Time Lamount Cohen Time) Disposition Final User 06/23/2022 9:37:04 AM Send to Urgent Sander Nephew 06/23/2022 9:42:41 AM Call EMS 911 Now Yes Lily Kocher, RN, Adriana 06/23/2022 9:53:41 AM 911 Outcome Documentation Lily Kocher, RN, Adriana PLEASE NOTE: All timestamps contained within this report are represented as [place] Standard Time. CONFIDENTIALTY NOTICE: This fax transmission is intended only for the addressee. It contains information that is legally privileged, confidential or otherwise protected from use or disclosure. If you are not the intended recipient, you are strictly prohibited from reviewing, disclosing, copying using or disseminating any of this information or taking any action in reliance on or regarding this information. If you have received this fax in error, please notify us immediately by telephone so that we can arrange for its return to [place]. Phone: [phone], Toll-Free: [phone], Fax: [phone] Page: 2 of 2 Call Id: [id number] Disp. Time Lamount Cohen Time) Disposition Final User Reason: refused. i  contacted office to inform them Final Disposition 06/23/2022 9:42:41 AM Call EMS 911 Now Yes Lily Kocher, RN, Ricki Rodriguez Caller Disagree/Comply Disagree Caller Understands Yes PreDisposition Call Doctor Care Advice Given Per Guideline CALL EMS 911 NOW: CARE ADVICE given per Chest Pain (Adult) guideline. Comments User: Jerilynn Birkenhead, RN Date/Time Lamount Cohen Time): 06/23/2022 9:45:08 AM spoke to [place] at the office

## 2022-06-23 NOTE — Discharge Instructions (Addendum)
At this time there does not appear to be the presence of an emergent medical condition, however there is always the potential for conditions to change. Please read and follow the below instructions.  Please return to the Emergency Department immediately for any new or worsening symptoms. Please be sure to follow up with your Primary Care Provider within one week regarding your visit today; please call their office to schedule an appointment even if you are feeling better for a follow-up visit. You may begin taking the medication Protonix as prescribed to help with possible acid reflux related symptoms.  You may use over-the-counter Tylenol as directed on the packaging to help with possible chest wall inflammation. Please call your primary care provider today to schedule a follow-up appointment.  You have been referred to [facility] health cardiology group, please follow-up with them for further evaluation.  Please read the additional information packets attached to your discharge summary.  Go to the nearest Emergency Department immediately if: You have fever or chills Your chest pain is worse. You have a cough that gets worse, or you cough up blood. You have very bad (severe) pain in your belly (abdomen). You pass out (faint). You have either of these for no clear reason: Sudden chest discomfort. Sudden discomfort in your arms, back, neck, or jaw. You have shortness of breath at any time. You suddenly start to sweat, or your skin gets clammy. You feel sick to your stomach (nauseous). You throw up (vomit). You suddenly feel lightheaded or dizzy. You feel very weak or tired. Your heart starts to beat fast, or it feels like it is skipping beats. You have any new/concerning or worsening symptoms.  Do not take your medicine if  develop an itchy rash, swelling in your mouth or lips, or difficulty breathing; call 911 and seek immediate emergency medical attention if this occurs.  You may review your  lab tests and imaging results in their entirety on your MyChart account.  Please discuss all results of fully with your primary care provider and other specialist at your follow-up visit.  Note: Portions of this text may have been transcribed using voice recognition software. Every effort was made to ensure accuracy; however, inadvertent computerized transcription errors may still be present.

## 2022-06-23 NOTE — Telephone Encounter (Signed)
Pt called stating she was experiencing he following symptoms:  -Feeling of elevated BP  -Headache  -Chest Discomfort  Pt was transferred to triage nurse for further eval.

## 2022-06-23 NOTE — ED Notes (Signed)
Pt ambulated to bathroom with steady gait. 

## 2022-06-23 NOTE — Telephone Encounter (Signed)
FYI:   Triage nurse called back and stated that she spoke with the pt and advised her to call 911. The pt declined. She advised her to at least go to the ER. The pt then "thanked her for her time and hung up".

## 2022-06-29 ENCOUNTER — Other Ambulatory Visit: Payer: Self-pay | Admitting: Family

## 2022-07-26 ENCOUNTER — Encounter: Payer: Self-pay | Admitting: Family

## 2022-07-26 ENCOUNTER — Ambulatory Visit: Payer: BC Managed Care – PPO | Admitting: Family

## 2022-07-26 DIAGNOSIS — F418 Other specified anxiety disorders: Secondary | ICD-10-CM | POA: Diagnosis not present

## 2022-07-26 DIAGNOSIS — I10 Essential (primary) hypertension: Secondary | ICD-10-CM

## 2022-07-26 DIAGNOSIS — E669 Obesity, unspecified: Secondary | ICD-10-CM | POA: Diagnosis not present

## 2022-07-26 DIAGNOSIS — M069 Rheumatoid arthritis, unspecified: Secondary | ICD-10-CM | POA: Diagnosis not present

## 2022-07-26 MED ORDER — FLUOXETINE HCL 20 MG PO TABS: 30.0000 mg | ORAL_TABLET | Freq: Every day | ORAL | 1 refills | Status: DC

## 2022-07-26 MED ORDER — AMLODIPINE BESYLATE 2.5 MG PO TABS: 2.5000 mg | ORAL_TABLET | Freq: Every day | ORAL | 0 refills | Status: DC

## 2022-07-26 NOTE — Assessment & Plan Note (Signed)
On an anti-inflammatory.  Clinically stable- followed by rheumatology.

## 2022-07-26 NOTE — Progress Notes (Signed)
Subjective:   By signing my name below, I, Cassell Clement, attest that this documentation has been prepared under the direction and in the presence of Birdie Sons, NP 07/26/2022   Patient ID: Dana Johnston, female    DOB: Oct 01, 1961, 61 y.o.   MRN: [id number]  Chief Complaint  Patient presents with   Hypertension    Here for follow up    HPI Patient is in today for an office visit  Refill: She is requesting a refill of 2.5 Mg of Amlodipine and 20 Mg of Prozac.  Blood Pressure: As of today's visit, her blood pressure is normal. She is currently taking 2.5 Mg of Amlodipine.  BP Readings from Last 3 Encounters:  07/26/22 (!) 125/56  06/23/22 114/63  03/25/22 135/60   Pulse Readings from Last 3 Encounters:  07/26/22 62  06/23/22 63  03/25/22 64   Mood: She is currently taking 20 Mg of Prozac. Her mood has been fluctuating which she believes could be due to external stressors. She is regularly following up with her therapist.  Rheumatoid: She is receiving a low dose of antiinflammatory medications. She believes that her rheumatoid is relatively stable.  Fatigue: Her fatigue has stabilized.  Weight: Her weight is fluctuating.  Wt Readings from Last 3 Encounters:  07/26/22 170 lb (77.1 kg)  06/23/22 165 lb (74.8 kg)  03/25/22 180 lb (81.6 kg)    ED Appointment: She was admitted to the ED for abnormal chest pains. She is scheduled for a follow-up with her cardiologist on 07/29/2022. Colonoscopy: Last completed on 02/26/2021 Pap Smear: Last completed on 01/04/2021 Immunizations: She is UTD on her tetanus vaccine.    Health Maintenance Due  Topic Date Due   Hepatitis C Screening  Never done   COVID-19 Vaccine (3 - Moderna risk series) 02/09/2021   INFLUENZA VACCINE  06/21/2022    Past Medical History:  Diagnosis Date   Allergy    Anxiety    Arthritis    Depression    Hand pain    Hypertension    Osteoarthritis    Overdose 05/01/2017   benadryl overdose in  setting of depressino   Polyarthralgia    Rheumatoid arthritis [facility])     Past Surgical History:  Procedure Laterality Date   MEDIAL PARTIAL KNEE REPLACEMENT Left 03/2016   WRIST SURGERY Right 08/27/2018    Family History  Problem Relation Age of Onset   Heart disease Mother        MI at age 46, smoker    Arthritis Father        died at 96 in MVA   Hyperlipidemia Father    Hypertension Father    Arthritis Maternal Grandmother    Heart disease Maternal Grandfather    Colon cancer Neg Hx    Colon polyps Neg Hx    Esophageal cancer Neg Hx    Rectal cancer Neg Hx    Stomach cancer Neg Hx     Social History   Socioeconomic History   Marital status: Married    Spouse name: Not on file   Number of children: Not on file   Years of education: Not on file   Highest education level: Not on file  Occupational History   Not on file  Tobacco Use   Smoking status: Never   Smokeless tobacco: Never  Vaping Use   Vaping Use: Never used  Substance and Sexual Activity   Alcohol use: Yes    Comment: Occasional margarita  Drug use: No   Sexual activity: Yes    Partners: Female  Other Topics Concern   Not on file  Social History Narrative   Married to [employer]   Works at a Health visitor job   Has 2 children (19 and 16) Adopted from [place]   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     amLODipine  (NORVASC) 2.5 MG tablet TAKE 1 TABLET BY MOUTH EVERY DAY 30 tablet 2   FLUoxetine (PROZAC) 20 MG tablet Take 1.5 tablets (30 mg total) by mouth daily. 90 tablet 1   pantoprazole (PROTONIX) 20 MG tablet Take 1 tablet (20 mg total) by mouth daily. 30 tablet 0   Adalimumab (HUMIRA) 40 MG/0.4ML PSKT Inject 40 mg into the skin every 14 (fourteen) days. 2 each    No facility-administered medications prior to visit.    Allergies  Allergen Reactions   Other Swelling    nuts   Sulfa Antibiotics Swelling    ROS    See HPI Objective:    Physical Exam Constitutional:      General: She is not in acute distress.    Appearance: Normal appearance. She is not ill-appearing.  HENT:     Head: Normocephalic and atraumatic.     Right Ear: External ear normal.     Left Ear: External ear normal.  Eyes:     Extraocular Movements: Extraocular movements intact.     Pupils: Pupils are equal, round, and reactive to light.  Cardiovascular:     Rate and Rhythm: Normal rate and regular rhythm.     Heart sounds: Normal heart sounds. No murmur heard.    No gallop.  Pulmonary:     Effort: Pulmonary effort is normal. No respiratory distress.     Breath sounds: Normal breath sounds. No wheezing or rales.  Skin:    General: Skin is warm and dry.  Neurological:     Mental Status: She is alert and oriented to person, place, and time.  Psychiatric:        Mood and Affect: Mood normal.        Behavior: Behavior normal.        Judgment: Judgment normal.     BP (!) 125/56 (BP Location: Right Arm, Patient Position: Sitting, Cuff Size: Small)   Pulse 62   Temp 98.2 F (36.8 C) (Oral)   Resp 16   Wt 170 lb (77.1 kg)   SpO2 99%   BMI 31.09 kg/m  Wt Readings from Last 3 Encounters:  07/26/22 170 lb (77.1 kg)  06/23/22 165 lb (74.8 kg)  03/25/22 180 lb (81.6 kg)       Assessment & Plan:   Problem List Items Addressed This Visit       Unprioritized   Rheumatoid arthritis (HCC)    On an  anti-inflammatory.  Clinically stable- followed by rheumatology.       Obesity (BMI 30.0-34.9)    Wt Readings from Last 3 Encounters:  07/26/22 170 lb (77.1 kg)  06/23/22 165 lb (74.8 kg)  03/25/22 180 lb (81.6 kg)  Continues to work on weight loss.       Hypertension    BP stable on amlodipine 2.5mg . Continue same.       Relevant Medications   amLODipine (NORVASC) 2.5 MG tablet   Depression with anxiety    Stable on prozac overall, has upcoming appointment with psychiatry.       Relevant Medications   FLUoxetine (PROZAC) 20 MG tablet   Meds ordered this encounter  Medications   FLUoxetine (PROZAC) 20 MG tablet    Sig: Take 1.5 tablets (30 mg total) by mouth daily.    Dispense:  90 tablet    Refill:  1    Order Specific Question:   Supervising Provider    Answer:   Danise Edge A [[id number]]   amLODipine (NORVASC) 2.5 MG tablet    Sig: Take 1 tablet (2.5 mg total) by mouth daily.    Dispense:  90 tablet    Refill:  0    Order Specific Question:   Supervising Provider    Answer:   Danise Edge A [[id number]]    I, Lemont Fillers, NP, personally preformed the services described in this documentation.  All medical record entries made by the scribe were at my direction and in my presence.  I have reviewed the chart and discharge instructions (if applicable) and agree that the record reflects my personal performance and is accurate and complete. 07/26/2022   I,Amber Collins,acting as a scribe for Lemont Fillers, NP.,have documented all relevant documentation on the behalf of Lemont Fillers, NP,as directed by  Lemont Fillers, NP while in the presence of Lemont Fillers, NP.    Lemont Fillers, NP

## 2022-07-26 NOTE — Assessment & Plan Note (Addendum)
Wt Readings from Last 3 Encounters:  07/26/22 170 lb (77.1 kg)  06/23/22 165 lb (74.8 kg)  03/25/22 180 lb (81.6 kg)   Continues to work on weight loss.

## 2022-07-26 NOTE — Assessment & Plan Note (Signed)
Stable on prozac overall, has upcoming appointment with psychiatry.

## 2022-07-26 NOTE — Assessment & Plan Note (Signed)
BP stable on amlodipine 2.5mg . Continue same.

## 2022-07-27 ENCOUNTER — Encounter: Payer: Self-pay | Admitting: Family

## 2022-07-27 MED ORDER — CELECOXIB 100 MG PO CAPS: 100.0000 mg | ORAL_CAPSULE | Freq: Two times a day (BID) | ORAL | Status: AC

## 2022-07-27 MED ORDER — ARIPIPRAZOLE 5 MG PO TABS
5.0000 mg | ORAL_TABLET | Freq: Every day | ORAL | Status: DC
Start: 2022-07-27 — End: 2023-07-07

## 2022-07-29 ENCOUNTER — Ambulatory Visit: Payer: BC Managed Care – PPO | Admitting: Cardiovascular Disease

## 2022-08-15 ENCOUNTER — Ambulatory Visit: Payer: BC Managed Care – PPO | Admitting: Physician Assistant

## 2022-08-19 ENCOUNTER — Ambulatory Visit: Payer: BC Managed Care – PPO | Attending: Cardiovascular Disease | Admitting: Cardiovascular Disease

## 2022-08-19 ENCOUNTER — Encounter: Payer: Self-pay | Admitting: Cardiovascular Disease

## 2022-08-19 VITALS — BP 130/68 | HR 69 | Ht 65.5 in | Wt 179.0 lb

## 2022-08-19 DIAGNOSIS — R079 Chest pain, unspecified: Secondary | ICD-10-CM | POA: Diagnosis not present

## 2022-08-19 DIAGNOSIS — I10 Essential (primary) hypertension: Secondary | ICD-10-CM

## 2022-08-19 NOTE — Patient Instructions (Signed)
Medication Instructions:  No changes *If you need a refill on your cardiac medications before your next appointment, please call your pharmacy*   Lab Work: None ordered If you have labs (blood work) drawn today and your tests are completely normal, you will receive your results only by: [place] (if you have MyChart) OR A paper copy in the mail If you have any lab test that is abnormal or we need to change your treatment, we will call you to review the results.   Testing/Procedures: Your physician has requested that you have an exercise tolerance test. For further information please visit [email or web address]. Please also follow instruction sheet, as given. This will take place at [place], [address]. Do not drink or eat foods with caffeine for 24 hours before the test. (Chocolate, coffee, tea, or energy drinks) If you use an inhaler, bring it with you to the test. Do not smoke for 4 hours before the test. Wear comfortable shoes and clothing.    Follow-Up: At [facility], you and your health needs are our priority.  As part of our continuing mission to provide you with exceptional heart care, we have created designated Provider Care Teams.  These Care Teams include your primary Cardiologist (physician) and Advanced Practice Providers (APPs -  Physician Assistants and Nurse Practitioners) who all work together to provide you with the care you need, when you need it.  We recommend signing up for the patient portal called "MyChart".  Sign up information is provided on this After Visit Summary.  MyChart is used to connect with patients for Virtual Visits (Telemedicine).  Patients are able to view lab/test results, encounter notes, upcoming appointments, etc.  Non-urgent messages can be sent to your provider as well.   To learn more about what you can do with MyChart, go to [email or web address].    Your next appointment:   Follow up as needed pending the  results  Important Information About Sugar

## 2022-08-19 NOTE — Progress Notes (Signed)
Cardiology Office Note:    Date:  08/19/2022   ID:  Dana Johnston, DOB [birth year], MRN [id number]  PCP:  [name], NP   [facility] Cardiologist:  Sanda Klein, MD     Referring MD: Deliah Boston, PA-C   Chief Complaint  Patient presents with   New Patient (Initial Visit)   Chest Pain  Dana Johnston is a 61 y.o. female who is being seen today for the evaluation of chest pain at the request of Deliah Boston, PA-C.   History of Present Illness:    Dana Johnston is a 61 y.o. female with a hx of treated hypertension, no other serious chronic illnesses, who had a single episode of severe chest discomfort at rest last month.  She was sitting at her desk when she had unexpected onset of tightness in the high retrosternal area.  She describes it using a gripping gesture with her hand.  It had never occurred before and has not occurred since.  When the symptoms had been going on for 20 or 30 minutes she went to the emergency room for evaluation.  She was still having discomfort when she got there and overall the chest discomfort lasted for couple of hours.  The ECG showed low risk findings and cardiac enzymes were normal.  The D-dimer was in normal range and the chest x-ray was unrevealing with a normal cardiac silhouette and narrow mediastinum.  Screening for influenza and COVID-19 were negative.  She is fairly active.  She walks 2 miles every other day about 45 minutes.  She has 2 dogs that carry her along at a fairly brisk pace.  She does not experience shortness of breath or angina with walking.  She denies palpitations, dizziness or syncope.  She does not have lower extremity edema or claudication.  She denies any strokelike symptoms.  She has never smoked.  She does not have diabetes mellitus.  LDL cholesterol without treatment was pretty good at 89.  Her mother died suddenly unexpectedly at age 47 without a history of coronary disease preceding her death.   She was a smoker.  Dana Johnston has a history of rheumatoid arthritis and osteoarthritis and sees Dr. Amil Amen.  She did not have any positive symptom relief with Humira and methotrexate so she only takes NSAIDs for her arthritis.  She has a longstanding history of "borderline" high blood pressure and started taking a low-dose of amlodipine about 6 months ago.  Her blood pressure is very well controlled.  Her electrocardiogram performed in the emergency room during pain is a normal tracing.  In our office today the QRS voltage is low in leads V3-V6 but I think this is a lead placement issue.  She does not have any repolarization abnormalities on either 1 of these tracings.  QTc 428 ms.  Past Medical History:  Diagnosis Date   Allergy    Anxiety    Arthritis    Depression    Hand pain    Hypertension    Osteoarthritis    Overdose 05/01/2017   benadryl overdose in setting of depressino   Polyarthralgia    Rheumatoid arthritis [facility])     Past Surgical History:  Procedure Laterality Date   MEDIAL PARTIAL KNEE REPLACEMENT Left 03/2016   WRIST SURGERY Right 08/27/2018    Current Medications: Current Meds  Medication Sig   amLODipine (NORVASC) 2.5 MG tablet Take 1 tablet (2.5 mg total) by mouth daily.   ARIPiprazole (ABILIFY) 5 MG tablet Take  1 tablet (5 mg total) by mouth daily.   celecoxib (CELEBREX) 100 MG capsule Take 1 capsule (100 mg total) by mouth 2 (two) times daily.   fexofenadine (ALLEGRA) 60 MG tablet Take 60 mg by mouth 2 (two) times daily.   FLUoxetine (PROZAC) 20 MG tablet Take 1.5 tablets (30 mg total) by mouth daily.     Allergies:   Other and Sulfa antibiotics   Social History   Socioeconomic History   Marital status: Married    Spouse name: Not on file   Number of children: Not on file   Years of education: Not on file   Highest education level: Not on file  Occupational History   Not on file  Tobacco Use   Smoking status: Never   Smokeless tobacco: Never   Vaping Use   Vaping Use: Never used  Substance and Sexual Activity   Alcohol use: Yes    Comment: Occasional margarita   Drug use: No   Sexual activity: Yes    Partners: Female  Other Topics Concern   Not on file  Social History Narrative   Married to [employer]   Works at a Therapist, occupational job   Has 2 children (53 and 44) Adopted from [place]   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 There is no history of Colon cancer, Colon polyps, Esophageal cancer, Rectal cancer, or Stomach cancer.  ROS:   Please see the history of present illness.     All other systems reviewed and are negative.  EKGs/Labs/Other Studies Reviewed:    The following studies were reviewed today: Notes, labs, chest x-ray from ER visit 06/23/2022  EKG:  EKG is  ordered today.  The ekg ordered today demonstrates sinus rhythm, questionable left atrial abnormality, decreased voltage V3-V6 may be related to lead placement.  QTc 428 ms.  Recent Labs: 02/11/2022: ALT 12; TSH 2.22 06/23/2022: BUN 15; Creatinine, Ser 1.01; Hemoglobin 13.7; Platelets 279; Potassium 4.2; Sodium 140  Recent Lipid Panel    Component Value Date/Time   CHOL 145 12/27/2019 1155   TRIG 52.0 12/27/2019 1155   HDL 45.10 12/27/2019 1155   CHOLHDL 3 12/27/2019 1155   VLDL 10.4 12/27/2019 1155   LDLCALC 89 12/27/2019 1155     Risk  Assessment/Calculations:                Physical Exam:    VS:  BP 130/68 (BP Location: Left Arm, Patient Position: Sitting, Cuff Size: Normal)   Pulse 69   Ht 5' 5.5" (1.664 m)   Wt 179 lb (81.2 kg)   BMI 29.33 kg/m     Wt Readings from Last 3 Encounters:  08/19/22 179 lb (81.2 kg)  07/26/22 170 lb (77.1 kg)  06/23/22 165 lb (74.8 kg)     GEN: Appears well, moderately overweight, well nourished, well  developed in no acute distress HEENT: Normal NECK: No JVD; No carotid bruits LYMPHATICS: No lymphadenopathy CARDIAC: RRR, no murmurs, rubs, gallops.  She has excellent bounding pulses in dorsalis pedis, posterior tibials and radials bilaterally.  There are no audible arterial bruits RESPIRATORY:  Clear to auscultation without rales, wheezing or rhonchi  ABDOMEN: Soft, non-tender, non-distended MUSCULOSKELETAL:  No edema; No deformity  SKIN: Warm and dry NEUROLOGIC:  Alert and oriented x 3 PSYCHIATRIC:  Normal affect   ASSESSMENT:    1. Chest pain of uncertain etiology   2. Essential hypertension    PLAN:    In order of problems listed above:  Precordial pain: The absence of ECG changes or troponin abnormalities in the setting of a prolonged episode of chest pain lasting 2 hours suggests that the mechanism was probably not related to coronary disease.  D-dimer was also negative and the chest x-ray was normal.  She has not had any recurrent symptoms and does not have chest discomfort during physical activity.  She has very little in the way of coronary risk factors (her mother was in her 19s when she passed and the diagnosis of vascular disease is uncertain; she has recent onset well treated hypertension).  Recommend a ECG stress test.  If this is normal, would not recommend further cardiac evaluation at this time. HTN: Very well treated on a low-dose of amlodipine.      Shared Decision Making/Informed Consent The risks [chest pain, shortness of breath, cardiac arrhythmias,  dizziness, blood pressure fluctuations, myocardial infarction, stroke/transient ischemic attack, and life-threatening complications (estimated to be 1 in 10,000)], benefits (risk stratification, diagnosing coronary artery disease, treatment guidance) and alternatives of an exercise tolerance test were discussed in detail with Dana Johnston and she agrees to proceed.    Medication Adjustments/Labs and Tests Ordered: Current medicines are reviewed at length with the patient today.  Concerns regarding medicines are outlined above.  Orders Placed This Encounter  Procedures   Exercise Tolerance Test   EKG 12-Lead   No orders of the defined types were placed in this encounter.   Patient Instructions  Medication Instructions:  No changes *If you need a refill on your cardiac medications before your next appointment, please call your pharmacy*   Lab Work: None ordered If you have labs (blood work) drawn today and your tests are completely normal, you will receive your results only by: [place] (if you have MyChart) OR A paper copy in the mail If you have any lab test that is abnormal or we need to change your treatment, we will call you to review the results.   Testing/Procedures: Your physician has requested that you have an exercise tolerance test. For further information please visit HugeFiesta.[email or web address]. Please also follow instruction sheet, as given. This will take place at [place], [address]. Do not drink or eat foods with caffeine for 24 hours before the test. (Chocolate, coffee, tea, or energy drinks) If you use an inhaler, bring it with you to the test. Do not smoke for 4 hours before the test. Wear comfortable shoes and clothing.    Follow-Up: At [facility], you and your health needs are our priority.  As part of our continuing mission to provide you with exceptional heart care, we have created designated Provider Care Teams.  These Care Teams include  your primary Cardiologist (physician) and Advanced Practice Providers (APPs -  Physician Assistants and Nurse Practitioners) who all work together to provide you with the care  you need, when you need it.  We recommend signing up for the patient portal called "MyChart".  Sign up information is provided on this After Visit Summary.  MyChart is used to connect with patients for Virtual Visits (Telemedicine).  Patients are able to view lab/test results, encounter notes, upcoming appointments, etc.  Non-urgent messages can be sent to your provider as well.   To learn more about what you can do with MyChart, go to [email or web address].    Your next appointment:   Follow up as needed pending the results  Important Information About Sugar         Signed, Sanda Klein, MD  08/19/2022 3:10 PM    [place]

## 2022-08-30 ENCOUNTER — Telehealth (HOSPITAL_COMMUNITY): Payer: Self-pay | Admitting: Cardiovascular Disease

## 2022-08-30 NOTE — Telephone Encounter (Signed)
Patient cancelled GXT for reason below:  08/30/2022 9:13 AM GG:Dana Johnston, Dana Johnston  Cancel Rsn: Patient (Unable to make appt (Work))  Order will be removed from the [facility] and when patient calls back to reschedule we will reinstate the order.

## 2022-09-02 ENCOUNTER — Ambulatory Visit (HOSPITAL_COMMUNITY)
Admission: RE | Admit: 2022-09-02 | Payer: BC Managed Care – PPO | Source: Ambulatory Visit | Attending: Cardiovascular Disease | Admitting: Cardiovascular Disease

## 2022-09-07 ENCOUNTER — Encounter: Payer: Self-pay | Admitting: Family

## 2022-10-25 ENCOUNTER — Other Ambulatory Visit: Payer: Self-pay | Admitting: Family

## 2022-11-16 ENCOUNTER — Encounter: Payer: Self-pay | Admitting: Family

## 2023-01-06 ENCOUNTER — Encounter: Payer: Self-pay | Admitting: Family

## 2023-01-06 ENCOUNTER — Ambulatory Visit (INDEPENDENT_AMBULATORY_CARE_PROVIDER_SITE_OTHER): Payer: BC Managed Care – PPO | Admitting: Family

## 2023-01-06 VITALS — BP 100/74 | HR 83 | Temp 97.8°F | Resp 16 | Ht 63.5 in | Wt 165.0 lb

## 2023-01-06 DIAGNOSIS — I10 Essential (primary) hypertension: Secondary | ICD-10-CM | POA: Diagnosis not present

## 2023-01-06 DIAGNOSIS — Z0000 Encounter for general adult medical examination without abnormal findings: Secondary | ICD-10-CM | POA: Diagnosis not present

## 2023-01-06 DIAGNOSIS — H9190 Unspecified hearing loss, unspecified ear: Secondary | ICD-10-CM

## 2023-01-06 DIAGNOSIS — F418 Other specified anxiety disorders: Secondary | ICD-10-CM | POA: Diagnosis not present

## 2023-01-06 LAB — COMPREHENSIVE METABOLIC PANEL
ALT: 10 U/L (ref 0–35)
AST: 14 U/L (ref 0–37)
Albumin: 4.1 g/dL (ref 3.5–5.2)
Alkaline Phosphatase: 111 U/L (ref 39–117)
BUN: 13 mg/dL (ref 6–23)
CO2: 30 mEq/L (ref 19–32)
Calcium: 8.9 mg/dL (ref 8.4–10.5)
Chloride: 103 mEq/L (ref 96–112)
Creatinine, Ser: 0.93 mg/dL (ref 0.40–1.20)
GFR: 67.03 mL/min (ref >60.00)
Glucose, Bld: 105 mg/dL — ABNORMAL HIGH (ref 70–99)
Potassium: 4 mEq/L (ref 3.5–5.1)
Sodium: 139 mEq/L (ref 135–145)
Total Bilirubin: 0.5 mg/dL (ref 0.2–1.2)
Total Protein: 6.5 g/dL (ref 6.0–8.3)

## 2023-01-06 LAB — LIPID PANEL
Cholesterol: 172 mg/dL (ref 0–200)
HDL: 50.1 mg/dL (ref >39.00)
LDL Cholesterol: 88 mg/dL (ref 0–99)
NonHDL: 122.16
Total CHOL/HDL Ratio: 3
Triglycerides: 170 mg/dL — ABNORMAL HIGH (ref 0.0–149.0)
VLDL: 34 mg/dL (ref 0.0–40.0)

## 2023-01-06 NOTE — Assessment & Plan Note (Signed)
BP appears overtreated. D/c amlodipine, repeat bp in 2 weeks.

## 2023-01-06 NOTE — Progress Notes (Addendum)
Subjective:   By signing my name below, I, [name], attest that this documentation has been prepared under the direction and in the presence of [name], NP. 01/06/2023   Patient ID: Dana Johnston, female    DOB: Mar 25, 1961, 62 y.o.   MRN: [id number]  Chief Complaint  Patient presents with   Annual Exam    HPI Patient is in today for a comprehensive physical exam.   She denies fever, unexpected weight change, adenopathy, new moles, sinus pain, sore throat, visual disturbance, chest pain, palpitations, leg swelling, cough, shortness of breath, wheezing, nausea, vomiting, diarrhea, constipation, blood in stool, dysuria, frequency, hematuria, new muscle pain, new joint pain, headaches, depression or anxiety at this time.     Social history: no changes  Immunizations:  due for Covid booster at the pharmacy  Diet: she is working hard on her diet.  Wt Readings from Last 3 Encounters:  01/06/23 165 lb (74.8 kg)  08/19/22 179 lb (81.2 kg)  07/26/22 170 lb (77.1 kg)   Exercise:  pickleball and walking  Colonoscopy: Last colonoscopy completed 02/26/21. Non-bleeding small grade 1 internal hemorrhoids were found. Due in 10 years.  Dexa: Last Dexa completed 11/28/2017. Results are normal.   Pap Smear: Last pap smear completed 01/04/2021. Results are normal. Due in 3 years.   Mammogram: Last mammogram was completed 03/23/2022. Results are normal. Due in one year.   Dental: up to date  Vision:  scheduled  Rheumatology- continues to follow with Rheumatology.   Past Medical History:  Diagnosis Date   Allergy    Anxiety    Arthritis    Depression    Hand pain    Hypertension    Osteoarthritis    Overdose 05/01/2017   benadryl overdose in setting of depressino   Polyarthralgia    Preventative health care 01/05/2022   Rheumatoid arthritis [facility])     Past Surgical History:  Procedure Laterality Date   MEDIAL PARTIAL KNEE REPLACEMENT Left 03/2016   WRIST  SURGERY Right 08/27/2018    Family History  Problem Relation Age of Onset   Heart disease Mother        MI at age 45, smoker    Arthritis Father        died at 54 in MVA   Hyperlipidemia Father    Hypertension Father    Arthritis Maternal Grandmother    Heart disease Maternal Grandfather    Colon cancer Neg Hx    Colon polyps Neg Hx    Esophageal cancer Neg Hx    Rectal cancer Neg Hx    Stomach cancer Neg Hx     Social History   Socioeconomic History   Marital status: Married    Spouse name: Not on file   Number of children: Not on file   Years of education: Not on file   Highest education level: Not on file  Occupational History   Not on file  Tobacco Use   Smoking status: Never   Smokeless tobacco: Never  Vaping Use   Vaping Use: Never used  Substance and Sexual Activity   Alcohol use: Yes    Comment: Occasional margarita   Drug use: No   Sexual activity: Yes    Partners: Female  Other Topics Concern   Not on file  Social History Narrative   Married to [employer]   Works at a Therapist, occupational job   Has 2 children (86 and 48) Adopted from [place]   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     celecoxib (CELEBREX) 100 MG capsule Take 1 capsule (100 mg total) by mouth 2 (two) times daily.     fexofenadine (ALLEGRA) 60 MG tablet Take 60 mg by mouth 2 (two) times daily.     FLUoxetine  (PROZAC) 40 MG capsule Take 40 mg by mouth daily.     amLODipine (NORVASC) 2.5 MG tablet TAKE 1 TABLET BY MOUTH EVERY DAY 30 tablet 2   FLUoxetine (PROZAC) 20 MG tablet Take 1.5 tablets (30 mg total) by mouth daily. 90 tablet 1   No facility-administered medications prior to visit.    Allergies  Allergen Reactions   Other Swelling    nuts   Sulfa Antibiotics Swelling    Review of Systems  Constitutional:  Negative for fever.       (-) unexpected weight change  HENT:  Positive for hearing loss. Negative for sinus pain and sore throat.   Eyes:  Negative for blurred vision.  Respiratory:  Negative for cough, shortness of breath and wheezing.   Cardiovascular:  Negative for palpitations and leg swelling.  Gastrointestinal:  Negative for blood in stool, constipation, diarrhea, nausea and vomiting.  Genitourinary:  Negative for dysuria, frequency and hematuria.  Musculoskeletal:  Positive for joint pain.       (-) new muscle pain (-) new joint pain  Skin:  Negative for rash.       (-) new moles  Neurological:  Positive for headaches (occasional mild headaches- attributes to working on computer. uses tylenol prn with good relief).  Psychiatric/Behavioral:  Negative for depression.        (-)anxiety       Objective:    Physical Exam Constitutional:      General: She is not in acute distress.    Appearance: Normal appearance.  HENT:     Head: Normocephalic and atraumatic.     Right Ear: Tympanic membrane, ear canal and external ear normal.     Left Ear: Tympanic membrane, ear canal and external ear normal.     Nose: Nose normal.     Mouth/Throat:     Mouth: Mucous membranes are moist.     Pharynx: Oropharynx is clear. No oropharyngeal exudate or posterior oropharyngeal erythema.  Eyes:     Extraocular Movements: Extraocular movements intact.     Right eye: No nystagmus.     Left eye: No nystagmus.     Pupils: Pupils are equal, round, and reactive to light.  Cardiovascular:      Rate and Rhythm: Normal rate and regular rhythm.     Heart sounds: Normal heart sounds. No murmur heard.    No gallop.  Pulmonary:     Effort: No respiratory distress.     Breath sounds: Normal breath sounds. No wheezing or rales.  Abdominal:  General: Bowel sounds are normal. There is no distension.     Palpations: Abdomen is soft.     Tenderness: There is no abdominal tenderness. There is no guarding.  Musculoskeletal:     Comments: (+) 5/5 strength for upper and lower extremities  Lymphadenopathy:     Cervical: No cervical adenopathy.  Skin:    General: Skin is warm.  Neurological:     Mental Status: She is alert and oriented to person, place, and time.     Deep Tendon Reflexes:     Reflex Scores:      Patellar reflexes are 2+ on the right side and 2+ on the left side. Psychiatric:        Judgment: Judgment normal.     BP 100/74 (BP Location: Right Arm, Patient Position: Sitting, Cuff Size: Small)   Pulse 83   Temp 97.8 F (36.6 C) (Oral)   Resp 16   Ht 5' 3.5" (1.613 m)   Wt 165 lb (74.8 kg)   SpO2 99%   BMI 28.77 kg/m  Wt Readings from Last 3 Encounters:  01/06/23 165 lb (74.8 kg)  08/19/22 179 lb (81.2 kg)  07/26/22 170 lb (77.1 kg)       Assessment & Plan:  Preventative health care Assessment & Plan: Continue healthy diet, exercise.  Mammo/pap up to date. Encouraged her to get her covid booster at the pharmacy.   Orders: -     Comprehensive metabolic panel -     Lipid panel  Hearing disorder, unspecified laterality -     Ambulatory referral to Audiology  Depression with anxiety Assessment & Plan: Stable, would like referral to to a new psychiatrist as he rpsychiatrist recently left.   Orders: -     Ambulatory referral to Psychiatry  Primary hypertension Assessment & Plan: BP appears overtreated. D/c amlodipine, repeat bp in 2 weeks.   Orders: -     Comprehensive metabolic panel -     Lipid panel    I, Nance Pear, NP,  personally preformed the services described in this documentation.  All medical record entries made by the scribe were at my direction and in my presence.  I have reviewed the chart and discharge instructions (if applicable) and agree that the record reflects my personal performance and is accurate and complete. 01/06/2023   I,[name],acting as a scribe for Nance Pear, NP.,have documented all relevant documentation on the behalf of Nance Pear, NP,as directed by  Nance Pear, NP while in the presence of Nance Pear, NP.   Nance Pear, NP

## 2023-01-06 NOTE — Assessment & Plan Note (Signed)
Stable, would like referral to to a new psychiatrist as he rpsychiatrist recently left.

## 2023-01-06 NOTE — Patient Instructions (Addendum)
Dr. Chucky May ([phone] Stop amlodipine.

## 2023-01-06 NOTE — Assessment & Plan Note (Signed)
Continue healthy diet, exercise.  Mammo/pap up to date. Encouraged her to get her covid booster at the pharmacy.

## 2023-01-20 ENCOUNTER — Ambulatory Visit (INDEPENDENT_AMBULATORY_CARE_PROVIDER_SITE_OTHER): Payer: BC Managed Care – PPO | Admitting: *Deleted

## 2023-01-20 DIAGNOSIS — I10 Essential (primary) hypertension: Secondary | ICD-10-CM

## 2023-01-20 NOTE — Progress Notes (Signed)
Pt here for BP since amlodipine was dc's two weeks ago.  Today's reading is 125/71.

## 2023-01-20 NOTE — Addendum Note (Signed)
Addended by: Beatris Ship L on: 01/20/2023 11:38 AM   Modules accepted: Level of Service

## 2023-01-26 ENCOUNTER — Other Ambulatory Visit: Payer: Self-pay | Admitting: Family

## 2023-03-06 ENCOUNTER — Encounter: Payer: Self-pay | Admitting: Family

## 2023-06-21 ENCOUNTER — Encounter (INDEPENDENT_AMBULATORY_CARE_PROVIDER_SITE_OTHER): Payer: Self-pay

## 2023-07-07 ENCOUNTER — Ambulatory Visit: Payer: BC Managed Care – PPO | Admitting: Family

## 2023-07-07 VITALS — BP 125/55 | HR 82 | Temp 98.6°F | Resp 16 | Wt 162.0 lb

## 2023-07-07 DIAGNOSIS — I10 Essential (primary) hypertension: Secondary | ICD-10-CM | POA: Diagnosis not present

## 2023-07-07 DIAGNOSIS — M069 Rheumatoid arthritis, unspecified: Secondary | ICD-10-CM | POA: Diagnosis not present

## 2023-07-07 DIAGNOSIS — Z1231 Encounter for screening mammogram for malignant neoplasm of breast: Secondary | ICD-10-CM

## 2023-07-07 DIAGNOSIS — F418 Other specified anxiety disorders: Secondary | ICD-10-CM | POA: Diagnosis not present

## 2023-07-07 NOTE — Progress Notes (Signed)
Subjective:     Dana Johnston ID: Dana Johnston, female    DOB: 1961/01/14, 62 y.o.   MRN: [id number]  Chief Complaint  Dana Johnston presents with   Depression    Here for follow up    Depression         Discussed Dana use of AI scribe software for clinical note transcription with Dana Johnston, who gave verbal consent to proceed.  History of Present Illness   Dana Johnston, with a history of anxiety and rheumatoid arthritis, presents for a follow-up visit. Dana Johnston reports discontinuing fluoxetine and Abilify four weeks ago due to feeling worse on Dana medication than off it. Dana Johnston describes a lack of joy and motivation, with everything feeling like a chore while Dana Johnston took Dana medication. Dana Johnston made Dana decision to stop Dana medication while on vacation 4 weeks ago and has been doing fine since. Dana Johnston still has Dana medication in case Dana Johnston needs to return to it and plans to follow up with her [facility] at [facility] soon.   Regarding her rheumatoid arthritis, Dana Johnston reports that it is present but Dana Johnston is having more problems with osteoarthritis in her hands, which hurt "all Dana tim".. Dana Johnston is on medication for this (celebrex) which is very helpful. Dana Johnston is currently seeing Azucena Fallen for rheumatology.          Health Maintenance Due  Topic Date Due   Hepatitis C Screening  Never done   COVID-19 Vaccine (3 - Moderna risk series) 02/09/2021   INFLUENZA VACCINE  06/22/2023    Past Medical History:  Diagnosis Date   Allergy    Anxiety    Arthritis    Depression    Hand pain    Hypertension    Osteoarthritis    Overdose 05/01/2017   benadryl overdose in setting of depressino   Polyarthralgia    Preventative health care 01/05/2022   Rheumatoid arthritis [facility])     Past Surgical History:  Procedure Laterality Date   MEDIAL PARTIAL KNEE REPLACEMENT Left 03/2016   WRIST SURGERY Right 08/27/2018    Family History  Problem Relation Age of Onset   Heart disease Mother        MI  at age 74, smoker    Arthritis Father        died at 72 in MVA   Hyperlipidemia Father    Hypertension Father    Arthritis Maternal Grandmother    Heart disease Maternal Grandfather    Colon cancer Neg Hx    Colon polyps Neg Hx    Esophageal cancer Neg Hx    Rectal cancer Neg Hx    Stomach cancer Neg Hx     Social History   Socioeconomic History   Marital status: Married    Spouse name: Not on file   Number of children: Not on file   Years of education: Not on file   Highest education level: Not on file  Occupational History   Not on file  Tobacco Use   Smoking status: Never   Smokeless tobacco: Never  Vaping Use   Vaping status: Never Used  Substance and Sexual Activity   Alcohol use: Yes    Comment: Occasional margarita   Drug use: No   Sexual activity: Yes    Partners: Female  Other Topics Concern   Not on file  Social History Narrative   Married to [employer]   Works at a Health visitor job   Has 2 children (19  and 16) Adopted from [place]   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, historic buildings) for specialty chemicals   3 dogs   5 cats   Does rescue work for foster cats in [place]   Completed 2 years of college   Enjoys yard work, Nurse, learning disability   Social Determinants of Corporate investment banker Strain: Not on Ship broker Insecurity: Not on file  Transportation Needs: Not on file  Physical Activity: Not on file  Stress: Not on file  Social Connections: Not on file  Intimate Partner Violence: Not on file    Outpatient Medications Prior to Visit  Medication Sig Dispense Refill   celecoxib (CELEBREX) 100 MG capsule Take 1 capsule (100 mg total) by mouth 2 (two) times daily.     ARIPiprazole (ABILIFY) 5 MG tablet Take 1 tablet (5 mg total) by mouth daily.     fexofenadine (ALLEGRA) 60 MG tablet Take 60 mg by mouth 2 (two) times daily.     FLUoxetine (PROZAC) 40 MG capsule Take 40 mg by mouth daily.     No facility-administered medications prior to visit.     Allergies  Allergen Reactions   Other Swelling    nuts   Sulfa Antibiotics Swelling    Review of Systems  Psychiatric/Behavioral:  Positive for depression.        Objective:    Physical Exam Constitutional:      General: Dana Johnston is not in acute distress.    Appearance: Normal appearance. Dana Johnston is well-developed.  HENT:     Head: Normocephalic and atraumatic.     Right Ear: External ear normal.     Left Ear: External ear normal.  Eyes:     General: No scleral icterus. Neck:     Thyroid: No thyromegaly.  Cardiovascular:     Rate and Rhythm: Normal rate and regular rhythm.     Heart sounds: Normal heart sounds. No murmur heard. Pulmonary:     Effort: Pulmonary effort is normal. No respiratory distress.     Breath sounds: Normal breath sounds. No wheezing.  Musculoskeletal:     Cervical back: Neck supple.  Skin:    General: Skin is warm and dry.  Neurological:     Mental Status: Dana Johnston is alert and oriented to person, place, and time.  Psychiatric:        Mood and Affect: Mood normal.        Behavior: Behavior normal.        Thought Content: Thought content normal.        Judgment: Judgment normal.      BP (!) 125/55 (BP Location: Right Arm, Dana Johnston Position: Sitting, Cuff Size: Small)   Pulse 82   Temp 98.6 F (37 C) (Oral)   Resp 16   Wt 162 lb (73.5 kg)   SpO2 98%   BMI 28.25 kg/m  Wt Readings from Last 3 Encounters:  07/07/23 162 lb (73.5 kg)  01/06/23 165 lb (74.8 kg)  08/19/22 179 lb (81.2 kg)       Assessment & Plan:   Problem List Items Addressed This Visit       Unprioritized   Rheumatoid arthritis (HCC)    Stable, management per Rheumatology.       Hypertension    BP Readings from Last 3 Encounters:  07/07/23 (!) 125/55  01/20/23 125/71  01/06/23 100/74   BP stable, not on antihypertensive. Monitor.       Depression with anxiety    Fair control  off of medication. Encouraged pt to schedule a follow up with her psychiatrist .       Other Visit Diagnoses     Breast cancer screening by mammogram    -  Primary   Relevant Orders   MM 3D SCREENING MAMMOGRAM BILATERAL BREAST       I have discontinued Brion Lange's fexofenadine, ARIPiprazole, and FLUoxetine. I am also having her maintain her celecoxib.  No orders of Dana defined types were placed in this encounter.

## 2023-07-07 NOTE — Patient Instructions (Signed)
VISIT SUMMARY:  During your recent visit, we discussed your ongoing issues with anxiety, depression, and rheumatoid arthritis. You mentioned that you stopped taking fluoxetine and Abilify due to feeling worse on the medication. You also reported persistent hand pain due to osteoarthritis, despite being on medication. We also discussed your preventive care needs.  YOUR PLAN:  -DEPRESSION/ANXIETY: You stopped taking your medication because you felt it was not helping. You've been off the medication for four weeks and report feeling fine. It's important to follow up with the [facility] for further evaluation and management of your mental health.  -RHEUMATOID ARTHRITIS AND OSTEOARTHRITIS: You're experiencing persistent hand pain due to osteoarthritis, even while on medication. You should continue your current treatment plan under the care of your rheumatologist, Azucena Fallen.  -PREVENTIVE CARE: To ensure your overall health, we've ordered a mammogram and recommended a Hepatitis C screening at your next blood draw. We also recommend getting a flu shot and a COVID booster in September 2024.  INSTRUCTIONS:  Please schedule a follow-up appointment for a physical after February 16th. Also, remember to schedule your appointments with the [facility] and Rheumatologist Azucena Fallen.

## 2023-07-07 NOTE — Assessment & Plan Note (Signed)
Stable, management per Rheumatology.  

## 2023-07-07 NOTE — Assessment & Plan Note (Signed)
Fair control off of medication. Encouraged pt to schedule a follow up with her psychiatrist .

## 2023-07-07 NOTE — Assessment & Plan Note (Signed)
BP Readings from Last 3 Encounters:  07/07/23 (!) 125/55  01/20/23 125/71  01/06/23 100/74   BP stable, not on antihypertensive. Monitor.

## 2023-07-10 ENCOUNTER — Inpatient Hospital Stay (HOSPITAL_BASED_OUTPATIENT_CLINIC_OR_DEPARTMENT_OTHER): Admission: RE | Admit: 2023-07-10 | Payer: BC Managed Care – PPO | Source: Ambulatory Visit

## 2023-07-31 ENCOUNTER — Ambulatory Visit (HOSPITAL_BASED_OUTPATIENT_CLINIC_OR_DEPARTMENT_OTHER): Payer: BC Managed Care – PPO

## 2023-08-01 ENCOUNTER — Ambulatory Visit: Payer: BC Managed Care – PPO | Admitting: Family

## 2023-08-01 VITALS — BP 130/52 | HR 68 | Temp 98.2°F | Resp 16 | Wt 162.0 lb

## 2023-08-01 DIAGNOSIS — R21 Rash and other nonspecific skin eruption: Secondary | ICD-10-CM | POA: Insufficient documentation

## 2023-08-01 MED ORDER — PREDNISONE 10 MG PO TABS: ORAL_TABLET | ORAL | 0 refills | Status: DC

## 2023-08-01 NOTE — Assessment & Plan Note (Signed)
  Diffuse pruritic rash of unclear etiology. No recent travel or changes in soaps. Does not appear to be related to insect bites or poison ivy. -Start Prednisone taper:40mg  for 2 days, 30mg  for 2 days, 20mg  for 2 days, 10mg  for 2 days. -Continue Benadryl as needed for itching. -If rash worsens or does not improve in a week, patient to notify the office.

## 2023-08-01 NOTE — Progress Notes (Signed)
Subjective:     Patient ID: Dana Johnston, female    DOB: [birth year], 62 y.o.   MRN: [id number]  Chief Complaint  Patient presents with   Rash    Complains of skin rash for 9 days    Rash    Discussed the use of AI scribe software for clinical note transcription with the patient, who gave verbal consent to proceed.  62 year old female presents to the clinic today for issues about a skin rash. The rash has been going on for about almost 2 weeks now per patient. She states she originally though she was bitten by a bug or something but the rash has gotten worse.No new lotions. No change in laundry detergent. No new soaps No new medications  Rash is constant itching. Red in appearance. Rash is small red vescular dots in appearance  Some fluid came out of some vesicles per patient. Dots appear on ankles, legs, thighs, arms, vagina, stomach and buttocks. Not noted on chest or back  Tried OTC creams but nothing is helping. Benadryl at night to help with the itching.   Tried Allergra OTC but did not help.    Health Maintenance Due  Topic Date Due   Hepatitis C Screening  Never done   COVID-19 Vaccine (3 - Moderna risk series) 02/09/2021   INFLUENZA VACCINE  06/22/2023    Past Medical History:  Diagnosis Date   Allergy    Anxiety    Arthritis    Depression    Hand pain    Hypertension    Osteoarthritis    Overdose 05/01/2017   benadryl overdose in setting of depressino   Polyarthralgia    Preventative health care 01/05/2022   Rheumatoid arthritis [facility])     Past Surgical History:  Procedure Laterality Date   MEDIAL PARTIAL KNEE REPLACEMENT Left 03/2016   WRIST SURGERY Right 08/27/2018    Family History  Problem Relation Age of Onset   Heart disease Mother        MI at age 73, smoker    Arthritis Father        died at 69 in MVA   Hyperlipidemia Father    Hypertension Father    Arthritis Maternal Grandmother    Heart disease Maternal Grandfather    Colon cancer  Neg Hx    Colon polyps Neg Hx    Esophageal cancer Neg Hx    Rectal cancer Neg Hx    Stomach cancer Neg Hx     Social History   Socioeconomic History   Marital status: Married    Spouse name: Not on file   Number of children: Not on file   Years of education: Not on file   Highest education level: Not on file  Occupational History   Not on file  Tobacco Use   Smoking status: Never   Smokeless tobacco: Never  Vaping Use   Vaping status: Never Used  Substance and Sexual Activity   Alcohol use: Yes    Comment: Occasional margarita   Drug use: No   Sexual activity: Yes    Partners: Female  Other Topics Concern   Not on file  Social History Narrative   Married to [employer]   Works at a Health visitor job   Has 2 children (19 and 16) Adopted from [place]   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     No facility-administered medications prior to visit.    Allergies  Allergen Reactions   Other Swelling    nuts   Sulfa Antibiotics Swelling    Review of Systems  Cardiovascular:  Negative for palpitations.  Skin:  Positive for itching and rash.  Psychiatric/Behavioral:  Negative for depression, substance abuse and suicidal ideas.        Objective:    Physical Exam Constitutional:      Appearance: Normal appearance.  HENT:     Head: Normocephalic.  Cardiovascular:      Rate and Rhythm: Normal rate and regular rhythm.  Pulmonary:     Effort: Pulmonary effort is normal.     Breath sounds: Normal breath sounds.  Skin:    Findings: Erythema and rash present.     Comments: Rash noted on thighs, ankles, stomach, arms, and buttocks.   Neurological:     General: No focal deficit present.     Mental Status: She is alert. Mental status is at baseline.  Psychiatric:        Mood and Affect: Mood normal.        Behavior: Behavior normal.        Thought Content: Thought content normal.        Judgment: Judgment normal.      BP (!) 130/52 (BP Location: Right Arm, Patient Position: Sitting, Cuff Size: Small)   Pulse 68   Temp 98.2 F (36.8 C) (Oral)   Resp 16   Wt 162 lb (73.5 kg)   SpO2 100%   BMI 28.25 kg/m  Wt Readings from Last 3 Encounters:  08/01/23 162 lb (73.5 kg)  07/07/23 162 lb (73.5 kg)  01/06/23 165 lb (74.8 kg)       Assessment & Plan:   Problem List Items Addressed This Visit   None   I am having Dana Johnston maintain her celecoxib.  No orders of the defined types were placed in this encounter.

## 2023-08-01 NOTE — Progress Notes (Signed)
Subjective:     Patient ID: Dana Johnston, female    DOB: 1961/03/29, 62 y.o.   MRN: [id number]  Chief Complaint  Patient presents with   Rash    Complains of skin rash for 9 days    Rash    Discussed the use of AI scribe software for clinical note transcription with the patient, who gave verbal consent to proceed.  History of Present Illness   The patient presents with an itchy rash that started on the bottom of her foot and has since spread to her thighs, underarms, and right shoulder. She initially thought it was bug bites as she had recently mowed the lawn and been to the beach, but the rash has worsened over the past week. She has been taking Benadryl to manage the itching and has been able to sleep fine. She has not traveled or stayed in any hotels recently. She has not changed soaps or other potential irritants. She denies any similar rashes between her fingers. She has tried Careers adviser, but it did not alleviate the symptoms.          Health Maintenance Due  Topic Date Due   Hepatitis C Screening  Never done   COVID-19 Vaccine (3 - Moderna risk series) 02/09/2021   INFLUENZA VACCINE  06/22/2023    Past Medical History:  Diagnosis Date   Allergy    Anxiety    Arthritis    Depression    Hand pain    Hypertension    Osteoarthritis    Overdose 05/01/2017   benadryl overdose in setting of depressino   Polyarthralgia    Preventative health care 01/05/2022   Rheumatoid arthritis [facility])     Past Surgical History:  Procedure Laterality Date   MEDIAL PARTIAL KNEE REPLACEMENT Left 03/2016   WRIST SURGERY Right 08/27/2018    Family History  Problem Relation Age of Onset   Heart disease Mother        MI at age 45, smoker    Arthritis Father        died at 54 in MVA   Hyperlipidemia Father    Hypertension Father    Arthritis Maternal Grandmother    Heart disease Maternal Grandfather    Colon cancer Neg Hx    Colon polyps Neg Hx    Esophageal cancer Neg Hx     Rectal cancer Neg Hx    Stomach cancer Neg Hx     Social History   Socioeconomic History   Marital status: Married    Spouse name: Not on file   Number of children: Not on file   Years of education: Not on file   Highest education level: Not on file  Occupational History   Not on file  Tobacco Use   Smoking status: Never   Smokeless tobacco: Never  Vaping Use   Vaping status: Never Used  Substance and Sexual Activity   Alcohol use: Yes    Comment: Occasional margarita   Drug use: No   Sexual activity: Yes    Partners: Female  Other Topics Concern   Not on file  Social History Narrative   Married to [employer]   Works at a Health visitor job   Has 2 children (19 and 16) Adopted from [place]   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     No facility-administered medications prior to visit.    Allergies  Allergen Reactions   Other Swelling    nuts   Sulfa Antibiotics Swelling    Review of Systems  Skin:  Positive for rash.       Objective:    Physical Exam Constitutional:      Appearance: Normal appearance.  Cardiovascular:     Rate and Rhythm: Normal rate.  Pulmonary:     Effort: Pulmonary effort is normal.  Skin:    General: Skin is warm and dry.     Findings: Rash present.     Comments: Diffuse macular rash thighs, abdomen   Neurological:     General: No  focal deficit present.     Mental Status: She is alert and oriented to person, place, and time. Mental status is at baseline.  Psychiatric:        Mood and Affect: Mood normal.        Behavior: Behavior normal.        Thought Content: Thought content normal.        Judgment: Judgment normal.       BP (!) 130/52 (BP Location: Right Arm, Patient Position: Sitting, Cuff Size: Small)   Pulse 68   Temp 98.2 F (36.8 C) (Oral)   Resp 16   Wt 162 lb (73.5 kg)   SpO2 100%   BMI 28.25 kg/m  Wt Readings from Last 3 Encounters:  08/01/23 162 lb (73.5 kg)  07/07/23 162 lb (73.5 kg)  01/06/23 165 lb (74.8 kg)       Assessment & Plan:   Problem List Items Addressed This Visit   None   I am having Athalia Darrington start on predniSONE. I am also having her maintain her celecoxib.  Meds ordered this encounter  Medications   predniSONE (DELTASONE) 10 MG tablet    Sig: 4 tabs by mouth once daily for 2 days, then 3 tabs daily x 2 days, then 2 tabs daily x 2 days, then 1 tab daily x 2 days    Dispense:  20 tablet    Refill:  0    Order Specific Question:   Supervising Provider    Answer:   Danise Edge A [[id number]]

## 2023-08-01 NOTE — Patient Instructions (Signed)
VISIT SUMMARY:  During your visit, we discussed the itchy rash that started on the bottom of your foot and has since spread to your thighs, underarms, and shoulder. You mentioned that you initially thought it was bug bites as you had recently mowed the lawn and been to the beach, but the rash has worsened over the past week. You have been taking Benadryl to manage the itching and have been able to sleep fine. You have not traveled or stayed in any hotels recently, and you have not changed soaps or other potential irritants. You also mentioned that you have tried Allegra, but it did not alleviate the symptoms.  YOUR PLAN:  -PRURITIC RASH: You have a widespread itchy rash, the cause of which is unclear. It does not seem to be related to insect bites or poison ivy. To manage this, we will start a course of Prednisone, a medication that reduces inflammation and suppresses the immune system, which should help with the rash. You should take 40mg  for 2 days, then 30mg  for 2 days, then 20mg  for 2 days, and finally 10mg  for 2 days. Continue taking Benadryl as needed for itching. If the rash worsens or does not improve in a week, please notify the office.  INSTRUCTIONS:  Start taking Prednisone as prescribed: 40mg  for 2 days, 30mg  for 2 days, 20mg  for 2 days, 10mg  for 2 days. Continue taking Benadryl as needed for itching. If the rash worsens or does not improve in a week, please notify the office.

## 2023-08-07 ENCOUNTER — Telehealth (HOSPITAL_BASED_OUTPATIENT_CLINIC_OR_DEPARTMENT_OTHER): Payer: Self-pay

## 2023-08-10 ENCOUNTER — Ambulatory Visit (HOSPITAL_BASED_OUTPATIENT_CLINIC_OR_DEPARTMENT_OTHER): Payer: BC Managed Care – PPO

## 2023-10-02 ENCOUNTER — Telehealth (INDEPENDENT_AMBULATORY_CARE_PROVIDER_SITE_OTHER): Payer: BC Managed Care – PPO | Admitting: Family Medicine

## 2023-10-02 ENCOUNTER — Encounter: Payer: Self-pay | Admitting: Family Medicine

## 2023-10-02 VITALS — Temp 100.0°F | Ht 63.5 in | Wt 155.0 lb

## 2023-10-02 DIAGNOSIS — J069 Acute upper respiratory infection, unspecified: Secondary | ICD-10-CM

## 2023-10-02 MED ORDER — PREDNISONE 20 MG PO TABS: 40.0000 mg | ORAL_TABLET | Freq: Every day | ORAL | 0 refills | Status: AC

## 2023-10-02 MED ORDER — BENZONATATE 200 MG PO CAPS: 200.0000 mg | ORAL_CAPSULE | Freq: Two times a day (BID) | ORAL | 0 refills | Status: AC | PRN

## 2023-10-02 MED ORDER — HYDROCOD POLI-CHLORPHE POLI ER 10-8 MG/5ML PO SUER: 5.0000 mL | Freq: Two times a day (BID) | ORAL | 0 refills | Status: AC | PRN

## 2023-10-02 NOTE — Patient Instructions (Signed)
Likely Viral Upper Respiratory Infection  Continue supportive measures including rest, hydration, humidifier use, steam showers, warm compresses to sinuses, warm liquids with lemon and honey, and over-the-counter cough, cold, and analgesics as needed. If symptoms persist 8-10 days, become severe, or return after a few days of feeling better, then please follow-up for repeat evaluation to determine if antibiotics may be necessary. Adding Tessalon and Tussionex for cough If not making progress in the next few days, you can try the prednisone burst  Over the counter medications that may be helpful for symptoms:  Guaifenesin 1200 mg extended release tabs twice daily, with plenty of water For cough and congestion Brand name: Mucinex   Pseudoephedrine 30 mg, one or two tabs every 4 to 6 hours For sinus congestion Brand name: Sudafed You must get this from the pharmacy counter.  Oxymetazoline nasal spray each morning, one spray in each nostril, for NO MORE THAN 3 days  For nasal and sinus congestion Brand name: Afrin Saline nasal spray or Saline Nasal Irrigation (Netti Pot, etc) 3-5 times a day For nasal and sinus congestion Brand names: Ocean or AYR Fluticasone nasal spray OR Mometasone nasal spray OR Triamcinolone Acetonide nasal spray - follow directions on the packaging For nasal and sinus congestion Brand name: Flonase, Nasonex, Nasacort Warm salt water gargles  For sore throat Every few hours as needed Alternate ibuprofen 400-600 mg and acetaminophen 1000 mg every 6 hours For fever, body aches, headache Brand names: Motrin or Advil and Tylenol Dextromethorphan 12-hour cough version 30 mg every 12 hours  For cough Brand name: Delsym Stop all other cold medications for now (Nyquil, Dayquil, Tylenol Cold, Theraflu, etc) and other non-prescription cough/cold preparations. Many of these have the same ingredients listed above and could cause an overdose of medication.   Herbal treatments  that have been shown to be helpful in some patients include: Vitamin C 1000 mg per day Zinc 100 mg per day Quercetin 25-500 mg twice a day Melatonin 5-10mg  at bedtime Honey Green Tea  General Instructions Allow your body to rest Drink PLENTY of fluids Typically, we are the most contagious 1-2 days before symptoms start through the first 2-3 days of most severe symptoms. Per CDC guidelines, you can return to school/work when symptoms have started to improve and you have been fever-free for 24 hours. However, recommend you continue extra precautions for the following 5 days (frequent hand hygiene, masking, covering coughs/sneezes, minimize exposure to immunocompromised individuals, etc).  If you develop severe shortness of breath, uncontrolled fevers, coughing up blood, confusion, chest pain, or signs of dehydration (such as significantly decreased urine amounts or dizziness with standing) please go to the nearest ER.

## 2023-10-02 NOTE — Progress Notes (Signed)
Started Friday  Wife - same symptoms - out of work 2 weeks  Fever  Fatigue Weakness Congestion/cough Headache  Covid/Flu negative  Taking tylenol/sudafed

## 2023-10-02 NOTE — Progress Notes (Signed)
Virtual Video Visit via MyChart Note  I connected with  Rickell Champlin on 10/02/23 at  3:40 PM EST by the video enabled telemedicine application for MyChart, and verified that I am speaking with the correct person using two identifiers.   I introduced myself as a Publishing rights manager with the practice. We discussed the limitations of evaluation and management by telemedicine and the availability of in person appointments. The patient expressed understanding and agreed to proceed.  Participating parties in this visit include: The patient and the nurse practitioner listed.  The patient is: At home I am: In the office - [facility]  Subjective:    CC:  Chief Complaint  Patient presents with   Fever    HPI: Dana Johnston is a 62 y.o. year old female presenting today via MyChart today for cough, URI.  Discussed the use of AI scribe software for clinical note transcription with the patient, who gave verbal consent to proceed.  History of Present Illness   The patient presents with symptoms consistent with a viral upper respiratory infection. She reports onset of symptoms on Friday, following exposure to wife who had been ill for a week and a half prior. The patient's symptoms include a low-grade fever peaking at 100 degrees, congestion, sore throat, headache, extreme fatigue, and watery, goopy eyes. She denies experiencing nausea, vomiting, diarrhea, chest pain, shortness of breath, or wheezing.  Over-the-counter treatments, including Sudafed for congestion and Tylenol for fever, have provided only temporary relief. The patient also reports a cough that has been disruptive to sleep. She has not recently taken steroids, but has tolerated prednisone well in the past.  The patient underwent testing for flu and COVID-19 two days after symptom onset, with both tests returning negative results. Her wife also tested negative for these illnesses. Despite the negative tests,  the patient's symptoms have persisted.                Past medical history, Surgical history, Family history not pertinant except as noted below, Social history, Allergies, and medications have been entered into the medical record, reviewed, and corrections made.   Review of Systems:  All review of systems negative except what is listed in the HPI   Objective:    General:  Speaking clearly in complete sentences. Absent shortness of breath noted.   Alert and oriented x3.   Normal judgment.  Absent acute distress.   Impression and Recommendations:    Problem List Items Addressed This Visit   None Visit Diagnoses     Viral URI with cough    -  Primary   Relevant Medications   benzonatate (TESSALON) 200 MG capsule   chlorpheniramine-HYDROcodone (TUSSIONEX) 10-8 MG/5ML   predniSONE (DELTASONE) 20 MG tablet     Likely Viral Upper Respiratory Infection  Continue supportive measures including rest, hydration, humidifier use, steam showers, warm compresses to sinuses, warm liquids with lemon and honey, and over-the-counter cough, cold, and analgesics as needed. If symptoms persist 8-10 days, become severe, or return after a few days of feeling better, then please follow-up for repeat evaluation to determine if antibiotics may be necessary. Adding Tessalon and Tussionex for cough If not making progress in the next few days, you can try the prednisone burst    Follow-up if symptoms worsen or fail to improve.    I discussed the assessment and treatment plan with the patient. The patient was provided an opportunity to ask questions and all were answered.  The patient agreed with the plan and demonstrated an understanding of the instructions.   The patient was advised to call back or seek an in-person evaluation if the symptoms worsen or if the condition fails to improve as anticipated.   Clayborne Dana, NP
